# Patient Record
Sex: Male | Born: 1965 | Race: Black or African American | Hispanic: No | Marital: Married | Smoking: Current every day smoker
Health system: Southern US, Community
[De-identification: ages and names within clinical notes are randomized; demographics above are authoritative.]

## PROBLEM LIST (undated history)

## (undated) DIAGNOSIS — F419 Anxiety disorder, unspecified: Secondary | ICD-10-CM

## (undated) DIAGNOSIS — G629 Polyneuropathy, unspecified: Secondary | ICD-10-CM

## (undated) DIAGNOSIS — I10 Essential (primary) hypertension: Secondary | ICD-10-CM

## (undated) DIAGNOSIS — K219 Gastro-esophageal reflux disease without esophagitis: Secondary | ICD-10-CM

## (undated) DIAGNOSIS — E7800 Pure hypercholesterolemia, unspecified: Secondary | ICD-10-CM

## (undated) DIAGNOSIS — K861 Other chronic pancreatitis: Secondary | ICD-10-CM

## (undated) HISTORY — PX: COLON SURGERY: SHX602

## (undated) HISTORY — PX: CHOLECYSTECTOMY: SHX55

---
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
## 1999-07-02 ENCOUNTER — Encounter: Payer: Self-pay | Admitting: Family Medicine

## 1999-07-02 ENCOUNTER — Ambulatory Visit (HOSPITAL_COMMUNITY): Admission: RE | Admit: 1999-07-02 | Discharge: 1999-07-02 | Payer: Self-pay | Admitting: Family Medicine

## 2000-10-04 HISTORY — PX: LEG SURGERY: SHX1003

## 2001-02-12 ENCOUNTER — Emergency Department (HOSPITAL_COMMUNITY): Admission: EM | Admit: 2001-02-12 | Discharge: 2001-02-13 | Payer: Self-pay | Admitting: *Deleted

## 2001-02-16 ENCOUNTER — Encounter: Payer: Self-pay | Admitting: Emergency Medicine

## 2001-02-16 ENCOUNTER — Emergency Department (HOSPITAL_COMMUNITY): Admission: EM | Admit: 2001-02-16 | Discharge: 2001-02-16 | Payer: Self-pay | Admitting: Emergency Medicine

## 2001-02-21 ENCOUNTER — Encounter: Payer: Self-pay | Admitting: Family Medicine

## 2001-02-21 ENCOUNTER — Ambulatory Visit (HOSPITAL_COMMUNITY): Admission: RE | Admit: 2001-02-21 | Discharge: 2001-02-21 | Payer: Self-pay | Admitting: Internal Medicine

## 2001-03-02 ENCOUNTER — Ambulatory Visit (HOSPITAL_COMMUNITY): Admission: RE | Admit: 2001-03-02 | Discharge: 2001-03-02 | Payer: Self-pay | Admitting: Family Medicine

## 2001-03-04 ENCOUNTER — Emergency Department (HOSPITAL_COMMUNITY): Admission: EM | Admit: 2001-03-04 | Discharge: 2001-03-04 | Payer: Self-pay | Admitting: *Deleted

## 2001-03-04 ENCOUNTER — Encounter: Payer: Self-pay | Admitting: *Deleted

## 2001-03-05 ENCOUNTER — Emergency Department (HOSPITAL_COMMUNITY): Admission: EM | Admit: 2001-03-05 | Discharge: 2001-03-05 | Payer: Self-pay | Admitting: Emergency Medicine

## 2001-03-05 ENCOUNTER — Encounter: Payer: Self-pay | Admitting: Emergency Medicine

## 2001-04-11 ENCOUNTER — Ambulatory Visit (HOSPITAL_COMMUNITY): Admission: RE | Admit: 2001-04-11 | Discharge: 2001-04-11 | Payer: Self-pay | Admitting: Family Medicine

## 2001-06-01 ENCOUNTER — Ambulatory Visit (HOSPITAL_COMMUNITY): Admission: RE | Admit: 2001-06-01 | Discharge: 2001-06-01 | Payer: Self-pay | Admitting: *Deleted

## 2001-06-01 ENCOUNTER — Encounter: Payer: Self-pay | Admitting: *Deleted

## 2001-08-15 ENCOUNTER — Ambulatory Visit (HOSPITAL_COMMUNITY): Admission: RE | Admit: 2001-08-15 | Discharge: 2001-08-15 | Payer: Self-pay | Admitting: Family Medicine

## 2001-08-15 ENCOUNTER — Encounter: Payer: Self-pay | Admitting: Family Medicine

## 2001-08-18 ENCOUNTER — Encounter: Payer: Self-pay | Admitting: Family Medicine

## 2001-08-18 ENCOUNTER — Ambulatory Visit (HOSPITAL_COMMUNITY): Admission: RE | Admit: 2001-08-18 | Discharge: 2001-08-18 | Payer: Self-pay | Admitting: Family Medicine

## 2001-08-28 ENCOUNTER — Encounter: Payer: Self-pay | Admitting: General Surgery

## 2001-08-28 ENCOUNTER — Inpatient Hospital Stay (HOSPITAL_COMMUNITY): Admission: RE | Admit: 2001-08-28 | Discharge: 2001-08-30 | Payer: Self-pay | Admitting: General Surgery

## 2001-10-27 ENCOUNTER — Ambulatory Visit (HOSPITAL_COMMUNITY): Admission: RE | Admit: 2001-10-27 | Discharge: 2001-10-27 | Payer: Self-pay | Admitting: Internal Medicine

## 2001-10-27 ENCOUNTER — Encounter (INDEPENDENT_AMBULATORY_CARE_PROVIDER_SITE_OTHER): Payer: Self-pay | Admitting: Internal Medicine

## 2001-12-06 ENCOUNTER — Ambulatory Visit (HOSPITAL_COMMUNITY): Admission: RE | Admit: 2001-12-06 | Discharge: 2001-12-06 | Payer: Self-pay | Admitting: Internal Medicine

## 2001-12-06 ENCOUNTER — Encounter (INDEPENDENT_AMBULATORY_CARE_PROVIDER_SITE_OTHER): Payer: Self-pay | Admitting: Internal Medicine

## 2001-12-18 ENCOUNTER — Ambulatory Visit (HOSPITAL_COMMUNITY): Admission: RE | Admit: 2001-12-18 | Discharge: 2001-12-18 | Payer: Self-pay | Admitting: Internal Medicine

## 2001-12-18 ENCOUNTER — Encounter (INDEPENDENT_AMBULATORY_CARE_PROVIDER_SITE_OTHER): Payer: Self-pay | Admitting: Internal Medicine

## 2002-05-04 ENCOUNTER — Ambulatory Visit (HOSPITAL_COMMUNITY): Admission: RE | Admit: 2002-05-04 | Discharge: 2002-05-04 | Payer: Self-pay | Admitting: Internal Medicine

## 2002-05-04 ENCOUNTER — Encounter (INDEPENDENT_AMBULATORY_CARE_PROVIDER_SITE_OTHER): Payer: Self-pay | Admitting: Internal Medicine

## 2002-10-26 ENCOUNTER — Encounter (INDEPENDENT_AMBULATORY_CARE_PROVIDER_SITE_OTHER): Payer: Self-pay | Admitting: Internal Medicine

## 2002-10-26 ENCOUNTER — Ambulatory Visit (HOSPITAL_COMMUNITY): Admission: RE | Admit: 2002-10-26 | Discharge: 2002-10-26 | Payer: Self-pay | Admitting: Internal Medicine

## 2002-11-13 ENCOUNTER — Ambulatory Visit (HOSPITAL_COMMUNITY): Admission: RE | Admit: 2002-11-13 | Discharge: 2002-11-13 | Payer: Self-pay | Admitting: General Surgery

## 2003-04-30 ENCOUNTER — Ambulatory Visit (HOSPITAL_COMMUNITY): Admission: RE | Admit: 2003-04-30 | Discharge: 2003-04-30 | Payer: Self-pay | Admitting: Family Medicine

## 2003-04-30 ENCOUNTER — Encounter: Payer: Self-pay | Admitting: Family Medicine

## 2004-08-10 ENCOUNTER — Ambulatory Visit: Payer: Self-pay | Admitting: Urgent Care

## 2004-08-11 ENCOUNTER — Ambulatory Visit: Payer: Self-pay | Admitting: Cardiology

## 2004-08-12 ENCOUNTER — Ambulatory Visit: Payer: Self-pay | Admitting: *Deleted

## 2004-08-12 ENCOUNTER — Ambulatory Visit (HOSPITAL_COMMUNITY): Admission: RE | Admit: 2004-08-12 | Discharge: 2004-08-12 | Payer: Self-pay | Admitting: Family Medicine

## 2004-08-13 ENCOUNTER — Ambulatory Visit: Payer: Self-pay | Admitting: Cardiology

## 2004-08-19 ENCOUNTER — Ambulatory Visit (HOSPITAL_COMMUNITY): Admission: RE | Admit: 2004-08-19 | Discharge: 2004-08-19 | Payer: Self-pay | Admitting: Internal Medicine

## 2004-09-09 ENCOUNTER — Ambulatory Visit: Payer: Self-pay | Admitting: Family Medicine

## 2004-10-01 ENCOUNTER — Ambulatory Visit: Payer: Self-pay | Admitting: Family Medicine

## 2004-10-07 ENCOUNTER — Ambulatory Visit (HOSPITAL_COMMUNITY): Admission: RE | Admit: 2004-10-07 | Discharge: 2004-10-07 | Payer: Self-pay | Admitting: Family Medicine

## 2004-11-02 ENCOUNTER — Ambulatory Visit: Payer: Self-pay | Admitting: Family Medicine

## 2004-11-04 ENCOUNTER — Ambulatory Visit: Payer: Self-pay | Admitting: Family Medicine

## 2004-11-18 ENCOUNTER — Ambulatory Visit: Payer: Self-pay | Admitting: Internal Medicine

## 2004-12-01 ENCOUNTER — Ambulatory Visit: Payer: Self-pay | Admitting: Family Medicine

## 2004-12-07 ENCOUNTER — Ambulatory Visit: Payer: Self-pay | Admitting: Cardiology

## 2004-12-29 ENCOUNTER — Ambulatory Visit: Payer: Self-pay | Admitting: Family Medicine

## 2005-01-19 ENCOUNTER — Ambulatory Visit: Payer: Self-pay | Admitting: Internal Medicine

## 2005-01-20 ENCOUNTER — Ambulatory Visit (HOSPITAL_COMMUNITY): Admission: RE | Admit: 2005-01-20 | Discharge: 2005-01-20 | Payer: Self-pay | Admitting: Internal Medicine

## 2005-02-18 ENCOUNTER — Ambulatory Visit: Payer: Self-pay | Admitting: Family Medicine

## 2005-02-25 ENCOUNTER — Ambulatory Visit: Payer: Self-pay | Admitting: Internal Medicine

## 2005-03-04 ENCOUNTER — Ambulatory Visit: Payer: Self-pay | Admitting: Family Medicine

## 2005-04-05 ENCOUNTER — Ambulatory Visit: Payer: Self-pay | Admitting: Family Medicine

## 2005-04-27 ENCOUNTER — Ambulatory Visit: Payer: Self-pay | Admitting: Internal Medicine

## 2005-05-31 ENCOUNTER — Ambulatory Visit: Payer: Self-pay | Admitting: Family Medicine

## 2005-07-30 ENCOUNTER — Ambulatory Visit: Payer: Self-pay | Admitting: *Deleted

## 2005-08-05 ENCOUNTER — Ambulatory Visit: Payer: Self-pay | Admitting: Family Medicine

## 2005-08-19 ENCOUNTER — Ambulatory Visit: Payer: Self-pay | Admitting: Family Medicine

## 2005-08-31 ENCOUNTER — Ambulatory Visit: Payer: Self-pay | Admitting: Internal Medicine

## 2005-10-25 ENCOUNTER — Ambulatory Visit: Payer: Self-pay | Admitting: Family Medicine

## 2005-11-24 ENCOUNTER — Ambulatory Visit: Payer: Self-pay | Admitting: Family Medicine

## 2005-12-09 ENCOUNTER — Ambulatory Visit: Payer: Self-pay | Admitting: Internal Medicine

## 2005-12-10 ENCOUNTER — Ambulatory Visit (HOSPITAL_COMMUNITY): Admission: RE | Admit: 2005-12-10 | Discharge: 2005-12-10 | Payer: Self-pay | Admitting: Internal Medicine

## 2006-01-31 ENCOUNTER — Ambulatory Visit: Payer: Self-pay | Admitting: Family Medicine

## 2006-03-03 ENCOUNTER — Ambulatory Visit: Payer: Self-pay | Admitting: Family Medicine

## 2006-04-14 ENCOUNTER — Ambulatory Visit: Payer: Self-pay | Admitting: Family Medicine

## 2006-04-28 ENCOUNTER — Ambulatory Visit: Payer: Self-pay | Admitting: Family Medicine

## 2006-05-09 ENCOUNTER — Ambulatory Visit: Payer: Self-pay | Admitting: Internal Medicine

## 2006-05-11 ENCOUNTER — Ambulatory Visit: Payer: Self-pay | Admitting: Family Medicine

## 2006-06-27 ENCOUNTER — Ambulatory Visit (HOSPITAL_COMMUNITY): Admission: RE | Admit: 2006-06-27 | Discharge: 2006-06-27 | Payer: Self-pay | Admitting: Nephrology

## 2006-07-07 ENCOUNTER — Ambulatory Visit: Payer: Self-pay | Admitting: Family Medicine

## 2006-07-20 ENCOUNTER — Ambulatory Visit: Payer: Self-pay | Admitting: Internal Medicine

## 2006-08-02 ENCOUNTER — Ambulatory Visit: Payer: Self-pay | Admitting: Family Medicine

## 2006-08-11 ENCOUNTER — Ambulatory Visit: Payer: Self-pay | Admitting: Family Medicine

## 2006-09-16 ENCOUNTER — Ambulatory Visit: Payer: Self-pay | Admitting: Family Medicine

## 2006-10-26 ENCOUNTER — Ambulatory Visit: Payer: Self-pay | Admitting: Family Medicine

## 2006-11-28 ENCOUNTER — Ambulatory Visit: Payer: Self-pay | Admitting: Family Medicine

## 2006-11-28 LAB — CONVERTED CEMR LAB
ALT: 16 units/L (ref 0–53)
Albumin: 3.7 g/dL (ref 3.5–5.2)
Bilirubin, Direct: 0.1 mg/dL (ref 0.0–0.3)
Total Protein: 7.5 g/dL (ref 6.0–8.3)

## 2007-01-05 ENCOUNTER — Encounter: Payer: Self-pay | Admitting: Family Medicine

## 2007-01-05 ENCOUNTER — Ambulatory Visit: Payer: Self-pay | Admitting: Internal Medicine

## 2007-01-05 LAB — CONVERTED CEMR LAB
ALT: 26 units/L (ref 0–53)
AST: 29 units/L (ref 0–37)
Alkaline Phosphatase: 248 units/L — ABNORMAL HIGH (ref 39–117)
Bilirubin, Direct: <0.1 mg/dL (ref 0.0–0.3)
Cholesterol: 343 mg/dL — ABNORMAL HIGH (ref 0–200)
HDL: 31 mg/dL — ABNORMAL LOW (ref >39)
Triglycerides: 1477 mg/dL — ABNORMAL HIGH (ref <150)

## 2007-01-24 ENCOUNTER — Ambulatory Visit: Payer: Self-pay | Admitting: Family Medicine

## 2007-02-08 ENCOUNTER — Ambulatory Visit (HOSPITAL_COMMUNITY): Admission: RE | Admit: 2007-02-08 | Discharge: 2007-02-08 | Payer: Self-pay | Admitting: Family Medicine

## 2007-03-09 ENCOUNTER — Ambulatory Visit: Payer: Self-pay | Admitting: Family Medicine

## 2007-03-15 ENCOUNTER — Encounter: Payer: Self-pay | Admitting: Family Medicine

## 2007-03-15 LAB — CONVERTED CEMR LAB
ALT: 29 units/L (ref 0–53)
Albumin: 3.6 g/dL (ref 3.5–5.2)
Bilirubin, Direct: 0.1 mg/dL (ref 0.0–0.3)
Total Bilirubin: 0.4 mg/dL (ref 0.3–1.2)
Total Protein: 6.8 g/dL (ref 6.0–8.3)

## 2007-05-09 ENCOUNTER — Ambulatory Visit: Payer: Self-pay | Admitting: Family Medicine

## 2007-06-02 ENCOUNTER — Encounter: Payer: Self-pay | Admitting: Family Medicine

## 2007-06-02 LAB — CONVERTED CEMR LAB
ALT: 16 units/L (ref 0–53)
Albumin: 3.8 g/dL (ref 3.5–5.2)
Glucose, Bld: 365 mg/dL — ABNORMAL HIGH (ref 70–99)
Indirect Bilirubin: 0.4 mg/dL (ref 0.0–0.9)
Microalb, Ur: 15.9 mg/dL — ABNORMAL HIGH (ref 0.00–1.89)
PSA: 0.34 ng/mL (ref 0.10–4.00)
Sodium: 133 meq/L — ABNORMAL LOW (ref 135–145)

## 2007-07-06 ENCOUNTER — Ambulatory Visit: Payer: Self-pay | Admitting: Family Medicine

## 2007-07-06 LAB — CONVERTED CEMR LAB
ALT: 19 units/L (ref 0–53)
Albumin: 3.9 g/dL (ref 3.5–5.2)
Bilirubin, Direct: 0.1 mg/dL (ref 0.0–0.3)

## 2007-08-15 ENCOUNTER — Ambulatory Visit: Payer: Self-pay | Admitting: Family Medicine

## 2007-08-15 LAB — CONVERTED CEMR LAB
ALT: 24 units/L (ref 0–53)
Amylase: 104 units/L (ref 0–105)
CO2: 26 meq/L (ref 19–32)
Calcium: 9.1 mg/dL (ref 8.4–10.5)
Chloride: 101 meq/L (ref 96–112)
Creatinine, Ser: 0.94 mg/dL (ref 0.40–1.50)
Lipase: 49 units/L (ref 0–75)
Sodium: 138 meq/L (ref 135–145)
Total Bilirubin: 0.6 mg/dL (ref 0.3–1.2)
Total Protein: 7.3 g/dL (ref 6.0–8.3)

## 2007-08-17 ENCOUNTER — Ambulatory Visit (HOSPITAL_COMMUNITY): Admission: RE | Admit: 2007-08-17 | Discharge: 2007-08-17 | Payer: Self-pay | Admitting: Family Medicine

## 2007-09-26 ENCOUNTER — Ambulatory Visit: Payer: Self-pay | Admitting: Family Medicine

## 2007-10-05 ENCOUNTER — Encounter: Payer: Self-pay | Admitting: Family Medicine

## 2007-10-19 ENCOUNTER — Encounter: Payer: Self-pay | Admitting: Family Medicine

## 2007-10-19 LAB — CONVERTED CEMR LAB
Albumin: 4.1 g/dL (ref 3.5–5.2)
Bilirubin, Direct: 0.1 mg/dL (ref 0.0–0.3)
HDL: 31 mg/dL — ABNORMAL LOW (ref >39)
Total Bilirubin: 0.6 mg/dL (ref 0.3–1.2)
Total CHOL/HDL Ratio: 13.1
Triglycerides: 2084 mg/dL — ABNORMAL HIGH (ref <150)

## 2007-11-28 ENCOUNTER — Ambulatory Visit: Payer: Self-pay | Admitting: Family Medicine

## 2007-11-29 ENCOUNTER — Ambulatory Visit (HOSPITAL_COMMUNITY): Admission: RE | Admit: 2007-11-29 | Discharge: 2007-11-29 | Payer: Self-pay | Admitting: Family Medicine

## 2007-11-30 ENCOUNTER — Telehealth: Payer: Self-pay | Admitting: Family Medicine

## 2008-01-19 ENCOUNTER — Ambulatory Visit: Payer: Self-pay | Admitting: Family Medicine

## 2008-01-26 DIAGNOSIS — E1065 Type 1 diabetes mellitus with hyperglycemia: Secondary | ICD-10-CM

## 2008-01-26 DIAGNOSIS — E785 Hyperlipidemia, unspecified: Secondary | ICD-10-CM | POA: Insufficient documentation

## 2008-01-26 DIAGNOSIS — F17200 Nicotine dependence, unspecified, uncomplicated: Secondary | ICD-10-CM

## 2008-01-26 DIAGNOSIS — K219 Gastro-esophageal reflux disease without esophagitis: Secondary | ICD-10-CM

## 2008-01-26 DIAGNOSIS — I10 Essential (primary) hypertension: Secondary | ICD-10-CM

## 2008-01-26 DIAGNOSIS — M549 Dorsalgia, unspecified: Secondary | ICD-10-CM | POA: Insufficient documentation

## 2008-01-26 DIAGNOSIS — IMO0002 Reserved for concepts with insufficient information to code with codable children: Secondary | ICD-10-CM | POA: Insufficient documentation

## 2008-01-26 DIAGNOSIS — F341 Dysthymic disorder: Secondary | ICD-10-CM

## 2008-01-26 DIAGNOSIS — R109 Unspecified abdominal pain: Secondary | ICD-10-CM | POA: Insufficient documentation

## 2008-02-02 ENCOUNTER — Ambulatory Visit: Payer: Self-pay | Admitting: Family Medicine

## 2008-11-23 ENCOUNTER — Ambulatory Visit: Payer: Self-pay | Admitting: Gastroenterology

## 2008-11-23 ENCOUNTER — Inpatient Hospital Stay (HOSPITAL_COMMUNITY): Admission: EM | Admit: 2008-11-23 | Discharge: 2008-11-27 | Payer: Self-pay | Admitting: Emergency Medicine

## 2008-11-25 ENCOUNTER — Ambulatory Visit: Payer: Self-pay | Admitting: Gastroenterology

## 2008-11-27 ENCOUNTER — Ambulatory Visit: Payer: Self-pay | Admitting: Internal Medicine

## 2008-12-24 ENCOUNTER — Encounter: Payer: Self-pay | Admitting: Gastroenterology

## 2009-12-12 ENCOUNTER — Emergency Department (HOSPITAL_COMMUNITY): Admission: EM | Admit: 2009-12-12 | Discharge: 2009-12-12 | Payer: Self-pay | Admitting: Emergency Medicine

## 2010-05-12 ENCOUNTER — Inpatient Hospital Stay (HOSPITAL_COMMUNITY): Admission: EM | Admit: 2010-05-12 | Discharge: 2010-05-14 | Payer: Self-pay | Admitting: Emergency Medicine

## 2010-09-08 ENCOUNTER — Emergency Department (HOSPITAL_COMMUNITY)
Admission: EM | Admit: 2010-09-08 | Discharge: 2010-09-08 | Payer: Self-pay | Source: Home / Self Care | Admitting: Emergency Medicine

## 2010-09-10 ENCOUNTER — Observation Stay (HOSPITAL_COMMUNITY): Admission: EM | Admit: 2010-09-10 | Discharge: 2009-12-30 | Payer: Self-pay | Admitting: Emergency Medicine

## 2010-10-25 ENCOUNTER — Encounter: Payer: Self-pay | Admitting: Nephrology

## 2010-10-25 ENCOUNTER — Encounter: Payer: Self-pay | Admitting: Family Medicine

## 2010-11-01 ENCOUNTER — Emergency Department (HOSPITAL_COMMUNITY)
Admission: EM | Admit: 2010-11-01 | Discharge: 2010-11-02 | Payer: Self-pay | Source: Home / Self Care | Admitting: Emergency Medicine

## 2010-11-01 LAB — CBC
HCT: 35 % — ABNORMAL LOW (ref 39.0–52.0)
MCH: 28 pg (ref 26.0–34.0)
MCHC: 34.3 g/dL (ref 30.0–36.0)
Platelets: 242 10*3/uL (ref 150–400)
RBC: 4.29 MIL/uL (ref 4.22–5.81)
WBC: 7 10*3/uL (ref 4.0–10.5)

## 2010-11-01 LAB — COMPREHENSIVE METABOLIC PANEL
ALT: 9 U/L (ref 0–53)
Alkaline Phosphatase: 126 U/L — ABNORMAL HIGH (ref 39–117)
Creatinine, Ser: 1.39 mg/dL (ref 0.4–1.5)
GFR calc non Af Amer: 55 mL/min — ABNORMAL LOW (ref >60)
Sodium: 135 mEq/L (ref 135–145)
Total Bilirubin: 0.2 mg/dL — ABNORMAL LOW (ref 0.3–1.2)
Total Protein: 6.2 g/dL (ref 6.0–8.3)

## 2010-11-01 LAB — GLUCOSE, CAPILLARY: Glucose-Capillary: 162 mg/dL — ABNORMAL HIGH (ref 70–99)

## 2010-11-01 LAB — DIFFERENTIAL
Basophils Relative: 1 % (ref 0–1)
Lymphs Abs: 2.2 10*3/uL (ref 0.7–4.0)
Monocytes Relative: 7 % (ref 3–12)
Neutrophils Relative %: 51 % (ref 43–77)

## 2010-11-01 LAB — LIPASE, BLOOD: Lipase: 20 U/L (ref 11–59)

## 2010-11-02 ENCOUNTER — Emergency Department (HOSPITAL_COMMUNITY)
Admission: EM | Admit: 2010-11-02 | Discharge: 2010-11-03 | Payer: Self-pay | Source: Home / Self Care | Admitting: Emergency Medicine

## 2010-11-02 LAB — URINALYSIS, ROUTINE W REFLEX MICROSCOPIC
Bilirubin Urine: NEGATIVE
Ketones, ur: NEGATIVE mg/dL
Nitrite: NEGATIVE
Protein, ur: NEGATIVE mg/dL
Specific Gravity, Urine: >1.03 — ABNORMAL HIGH (ref 1.005–1.030)
Urobilinogen, UA: 0.2 mg/dL (ref 0.0–1.0)
pH: 6 (ref 5.0–8.0)

## 2010-11-03 LAB — BASIC METABOLIC PANEL
BUN: 10 mg/dL (ref 6–23)
CO2: 20 mEq/L (ref 19–32)
Chloride: 113 mEq/L — ABNORMAL HIGH (ref 96–112)
Potassium: 5.1 mEq/L (ref 3.5–5.1)

## 2010-11-03 LAB — GLUCOSE, CAPILLARY: Glucose-Capillary: 175 mg/dL — ABNORMAL HIGH (ref 70–99)

## 2010-11-03 NOTE — Letter (Signed)
Summary: office notes chart 1  office notes chart 1   Imported By: Lind Guest 06/26/2010 13:13:34  _____________________________________________________________________  External Attachment:    Type:   Image     Comment:   External Document

## 2010-11-03 NOTE — Letter (Signed)
Summary: office notes chart 2  office notes chart 2   Imported By: Lind Guest 06/30/2010 09:05:18  _____________________________________________________________________  External Attachment:    Type:   Image     Comment:   External Document

## 2010-11-03 NOTE — Letter (Signed)
Summary: labs  labs   Imported By: Lind Guest 06/30/2010 08:58:18  _____________________________________________________________________  External Attachment:    Type:   Image     Comment:   External Document

## 2010-11-03 NOTE — Letter (Signed)
Summary: PHONE NOTES CHART 2  PHONE NOTES CHART 2   Imported By: Lind Guest 06/30/2010 08:49:04  _____________________________________________________________________  External Attachment:    Type:   Image     Comment:   External Document

## 2010-11-03 NOTE — Letter (Signed)
Summary: misc chart 1  misc chart 1   Imported By: Lind Guest 06/26/2010 13:12:19  _____________________________________________________________________  External Attachment:    Type:   Image     Comment:   External Document

## 2010-11-03 NOTE — Letter (Signed)
Summary: consults chart 1  consults chart 1   Imported By: Lind Guest 06/26/2010 13:13:00  _____________________________________________________________________  External Attachment:    Type:   Image     Comment:   External Document

## 2010-11-03 NOTE — Letter (Signed)
Summary: demo chart 2  demo chart 2   Imported By: Lind Guest 06/30/2010 09:26:13  _____________________________________________________________________  External Attachment:    Type:   Image     Comment:   External Document

## 2010-11-03 NOTE — Letter (Signed)
Summary: labs chart 1  labs chart 1   Imported By: Lind Guest 06/26/2010 13:11:36  _____________________________________________________________________  External Attachment:    Type:   Image     Comment:   External Document

## 2010-11-03 NOTE — Letter (Signed)
Summary: x rays chart 2  x rays chart 2   Imported By: Lind Guest 06/30/2010 08:59:29  _____________________________________________________________________  External Attachment:    Type:   Image     Comment:   External Document

## 2010-11-03 NOTE — Letter (Signed)
Summary: MISC CHART 2  MISC CHART 2   Imported By: Lind Guest 06/30/2010 08:52:45  _____________________________________________________________________  External Attachment:    Type:   Image     Comment:   External Document

## 2010-11-03 NOTE — Letter (Signed)
Summary: CONSULTS CHART 2  CONSULTS CHART 2   Imported By: Lind Guest 06/30/2010 08:48:30  _____________________________________________________________________  External Attachment:    Type:   Image     Comment:   External Document

## 2010-11-03 NOTE — Letter (Signed)
Summary: phone notes chart 1  phone notes chart 1   Imported By: Lind Guest 06/26/2010 13:14:25  _____________________________________________________________________  External Attachment:    Type:   Image     Comment:   External Document

## 2010-11-03 NOTE — Letter (Signed)
Summary: history and physical chart 2  history and physical chart 2   Imported By: Lind Guest 06/30/2010 09:25:15  _____________________________________________________________________  External Attachment:    Type:   Image     Comment:   External Document

## 2010-11-03 NOTE — Letter (Signed)
Summary: demo chart 1  demo chart 1   Imported By: Lind Guest 06/26/2010 13:10:10  _____________________________________________________________________  External Attachment:    Type:   Image     Comment:   External Document

## 2010-12-14 LAB — DIFFERENTIAL
Lymphocytes Relative: 39 % (ref 12–46)
Lymphs Abs: 3.9 10*3/uL (ref 0.7–4.0)
Monocytes Relative: 6 % (ref 3–12)
Neutro Abs: 4.7 10*3/uL (ref 1.7–7.7)
Neutrophils Relative %: 47 % (ref 43–77)

## 2010-12-14 LAB — BASIC METABOLIC PANEL
CO2: 17 mEq/L — ABNORMAL LOW (ref 19–32)
Calcium: 9 mg/dL (ref 8.4–10.5)
GFR calc Af Amer: 41 mL/min — ABNORMAL LOW (ref >60)
GFR calc non Af Amer: 34 mL/min — ABNORMAL LOW (ref >60)
Potassium: 4.3 mEq/L (ref 3.5–5.1)
Sodium: 136 mEq/L (ref 135–145)

## 2010-12-14 LAB — URINALYSIS, ROUTINE W REFLEX MICROSCOPIC
Bilirubin Urine: NEGATIVE
Glucose, UA: 250 mg/dL — AB
Nitrite: NEGATIVE
Specific Gravity, Urine: >1.03 — ABNORMAL HIGH (ref 1.005–1.030)
pH: 5 (ref 5.0–8.0)

## 2010-12-14 LAB — HEPATIC FUNCTION PANEL
ALT: 11 U/L (ref 0–53)
AST: 17 U/L (ref 0–37)
Albumin: 3 g/dL — ABNORMAL LOW (ref 3.5–5.2)
Alkaline Phosphatase: 193 U/L — ABNORMAL HIGH (ref 39–117)
Total Protein: 6.7 g/dL (ref 6.0–8.3)

## 2010-12-14 LAB — CBC
Hemoglobin: 13.4 g/dL (ref 13.0–17.0)
RBC: 4.8 MIL/uL (ref 4.22–5.81)

## 2010-12-18 LAB — GLUCOSE, CAPILLARY
Glucose-Capillary: 113 mg/dL — ABNORMAL HIGH (ref 70–99)
Glucose-Capillary: 127 mg/dL — ABNORMAL HIGH (ref 70–99)
Glucose-Capillary: 175 mg/dL — ABNORMAL HIGH (ref 70–99)
Glucose-Capillary: 287 mg/dL — ABNORMAL HIGH (ref 70–99)
Glucose-Capillary: 42 mg/dL — CL (ref 70–99)
Glucose-Capillary: 460 mg/dL — ABNORMAL HIGH (ref 70–99)
Glucose-Capillary: 53 mg/dL — ABNORMAL LOW (ref 70–99)

## 2010-12-18 LAB — BASIC METABOLIC PANEL
CO2: 19 mEq/L (ref 19–32)
Chloride: 111 mEq/L (ref 96–112)
Creatinine, Ser: 1.38 mg/dL (ref 0.4–1.5)
GFR calc Af Amer: >60 mL/min (ref >60)

## 2010-12-18 LAB — URINE MICROSCOPIC-ADD ON

## 2010-12-18 LAB — COMPREHENSIVE METABOLIC PANEL
Alkaline Phosphatase: 186 U/L — ABNORMAL HIGH (ref 39–117)
BUN: 24 mg/dL — ABNORMAL HIGH (ref 6–23)
Glucose, Bld: 457 mg/dL — ABNORMAL HIGH (ref 70–99)
Potassium: 4.1 mEq/L (ref 3.5–5.1)
Total Protein: 7.4 g/dL (ref 6.0–8.3)

## 2010-12-18 LAB — CBC
HCT: 43.5 % (ref 39.0–52.0)
MCH: 28.1 pg (ref 26.0–34.0)
MCH: 28.2 pg (ref 26.0–34.0)
MCHC: 33.3 g/dL (ref 30.0–36.0)
MCV: 84 fL (ref 78.0–100.0)
MCV: 84.3 fL (ref 78.0–100.0)
Platelets: 191 10*3/uL (ref 150–400)
RBC: 4.17 MIL/uL — ABNORMAL LOW (ref 4.22–5.81)
RDW: 14.6 % (ref 11.5–15.5)
WBC: 10.7 10*3/uL — ABNORMAL HIGH (ref 4.0–10.5)

## 2010-12-18 LAB — URINE CULTURE

## 2010-12-18 LAB — CHLORIDE, URINE, RANDOM: Chloride Urine: 37 mEq/L

## 2010-12-18 LAB — BLOOD GAS, ARTERIAL
Bicarbonate: 16.5 mEq/L — ABNORMAL LOW (ref 20.0–24.0)
FIO2: 0.21 %
O2 Saturation: 97.4 %
Patient temperature: 37

## 2010-12-18 LAB — RAPID URINE DRUG SCREEN, HOSP PERFORMED
Benzodiazepines: NOT DETECTED
Cocaine: NOT DETECTED
Tetrahydrocannabinol: NOT DETECTED

## 2010-12-18 LAB — DIFFERENTIAL
Basophils Absolute: 0.1 10*3/uL (ref 0.0–0.1)
Basophils Relative: 1 % (ref 0–1)
Eosinophils Absolute: 0.3 10*3/uL (ref 0.0–0.7)
Eosinophils Relative: 4 % (ref 0–5)
Lymphs Abs: 2.6 10*3/uL (ref 0.7–4.0)
Monocytes Relative: 2 % — ABNORMAL LOW (ref 3–12)
Monocytes Relative: 6 % (ref 3–12)
Neutro Abs: 7.9 10*3/uL — ABNORMAL HIGH (ref 1.7–7.7)
Neutrophils Relative %: 73 % (ref 43–77)

## 2010-12-18 LAB — CULTURE, BLOOD (ROUTINE X 2)
Report Status: 8142011
Report Status: 8142011

## 2010-12-18 LAB — HEMOGLOBIN A1C
Hgb A1c MFr Bld: 11.8 % — ABNORMAL HIGH (ref <5.7)
Hgb A1c MFr Bld: 12.6 % — ABNORMAL HIGH (ref <5.7)
Mean Plasma Glucose: 315 mg/dL — ABNORMAL HIGH (ref <117)

## 2010-12-18 LAB — LIPID PANEL
Cholesterol: 183 mg/dL (ref 0–200)
LDL Cholesterol: 82 mg/dL (ref 0–99)
Triglycerides: 325 mg/dL — ABNORMAL HIGH (ref <150)

## 2010-12-18 LAB — URINALYSIS, ROUTINE W REFLEX MICROSCOPIC
Glucose, UA: 500 mg/dL — AB
pH: 5.5 (ref 5.0–8.0)

## 2010-12-18 LAB — TSH: TSH: 0.526 u[IU]/mL (ref 0.350–4.500)

## 2010-12-18 LAB — PROTIME-INR
INR: 1.18 (ref 0.00–1.49)
Prothrombin Time: 15.2 seconds (ref 11.6–15.2)

## 2010-12-25 LAB — MRSA PCR SCREENING: MRSA by PCR: NEGATIVE

## 2010-12-25 LAB — COMPREHENSIVE METABOLIC PANEL
ALT: 14 U/L (ref 0–53)
AST: 17 U/L (ref 0–37)
Albumin: 3.3 g/dL — ABNORMAL LOW (ref 3.5–5.2)
Alkaline Phosphatase: 180 U/L — ABNORMAL HIGH (ref 39–117)
BUN: 24 mg/dL — ABNORMAL HIGH (ref 6–23)
CO2: 23 mEq/L (ref 19–32)
Calcium: 9.1 mg/dL (ref 8.4–10.5)
Chloride: 90 mEq/L — ABNORMAL LOW (ref 96–112)
Creatinine, Ser: 1.48 mg/dL (ref 0.4–1.5)
GFR calc Af Amer: >60 mL/min (ref >60)
GFR calc non Af Amer: 51 mL/min — ABNORMAL LOW (ref >60)
Glucose, Bld: 840 mg/dL (ref 70–99)
Potassium: 4.9 mEq/L (ref 3.5–5.1)
Sodium: 122 mEq/L — ABNORMAL LOW (ref 135–145)
Total Bilirubin: 0.8 mg/dL (ref 0.3–1.2)
Total Protein: 7.3 g/dL (ref 6.0–8.3)

## 2010-12-25 LAB — BASIC METABOLIC PANEL
BUN: 21 mg/dL (ref 6–23)
BUN: 22 mg/dL (ref 6–23)
Chloride: 100 mEq/L (ref 96–112)
Chloride: 103 mEq/L (ref 96–112)
Creatinine, Ser: 1.13 mg/dL (ref 0.4–1.5)
Glucose, Bld: 595 mg/dL (ref 70–99)
Potassium: 4 mEq/L (ref 3.5–5.1)

## 2010-12-25 LAB — DIFFERENTIAL
Basophils Absolute: 0 10*3/uL (ref 0.0–0.1)
Basophils Absolute: 0.1 10*3/uL (ref 0.0–0.1)
Basophils Relative: 1 % (ref 0–1)
Basophils Relative: 1 % (ref 0–1)
Eosinophils Absolute: 0.3 10*3/uL (ref 0.0–0.7)
Eosinophils Absolute: 0.6 10*3/uL (ref 0.0–0.7)
Eosinophils Relative: 4 % (ref 0–5)
Eosinophils Relative: 6 % — ABNORMAL HIGH (ref 0–5)
Lymphocytes Relative: 25 % (ref 12–46)
Lymphocytes Relative: 29 % (ref 12–46)
Lymphs Abs: 2.3 10*3/uL (ref 0.7–4.0)
Lymphs Abs: 2.4 10*3/uL (ref 0.7–4.0)
Monocytes Absolute: 0.5 10*3/uL (ref 0.1–1.0)
Monocytes Absolute: 0.7 10*3/uL (ref 0.1–1.0)
Monocytes Relative: 6 % (ref 3–12)
Monocytes Relative: 7 % (ref 3–12)
Neutro Abs: 4.8 10*3/uL (ref 1.7–7.7)
Neutro Abs: 6 10*3/uL (ref 1.7–7.7)
Neutrophils Relative %: 60 % (ref 43–77)
Neutrophils Relative %: 61 % (ref 43–77)

## 2010-12-25 LAB — URINALYSIS, ROUTINE W REFLEX MICROSCOPIC
Bilirubin Urine: NEGATIVE
Glucose, UA: >1000 mg/dL — AB
Ketones, ur: NEGATIVE mg/dL
Leukocytes, UA: NEGATIVE
Nitrite: NEGATIVE
Protein, ur: NEGATIVE mg/dL
Specific Gravity, Urine: <1.005 — ABNORMAL LOW (ref 1.005–1.030)
Urobilinogen, UA: 0.2 mg/dL (ref 0.0–1.0)
pH: 5.5 (ref 5.0–8.0)

## 2010-12-25 LAB — GLUCOSE, CAPILLARY
Glucose-Capillary: 118 mg/dL — ABNORMAL HIGH (ref 70–99)
Glucose-Capillary: 219 mg/dL — ABNORMAL HIGH (ref 70–99)
Glucose-Capillary: 231 mg/dL — ABNORMAL HIGH (ref 70–99)
Glucose-Capillary: 61 mg/dL — ABNORMAL LOW (ref 70–99)

## 2010-12-25 LAB — BLOOD GAS, ARTERIAL
Acid-base deficit: 2.8 mmol/L — ABNORMAL HIGH (ref 0.0–2.0)
Bicarbonate: 22.7 mEq/L (ref 20.0–24.0)
FIO2: 21 %
O2 Saturation: 63.2 %
Patient temperature: 37
pCO2 arterial: 48.1 mmHg — ABNORMAL HIGH (ref 35.0–45.0)
pH, Arterial: 7.296 — ABNORMAL LOW (ref 7.350–7.450)
pO2, Arterial: 32.7 mmHg — CL (ref 80.0–100.0)

## 2010-12-25 LAB — CBC
Hemoglobin: 15.4 g/dL (ref 13.0–17.0)
MCHC: 33.2 g/dL (ref 30.0–36.0)
MCHC: 34.8 g/dL (ref 30.0–36.0)
MCV: 81.8 fL (ref 78.0–100.0)
MCV: 86.1 fL (ref 78.0–100.0)
Platelets: 167 10*3/uL (ref 150–400)
RDW: 13.9 % (ref 11.5–15.5)
RDW: 14.2 % (ref 11.5–15.5)
WBC: 9.9 10*3/uL (ref 4.0–10.5)

## 2010-12-25 LAB — URINE MICROSCOPIC-ADD ON

## 2010-12-25 LAB — LIPASE, BLOOD: Lipase: 20 U/L (ref 11–59)

## 2010-12-25 LAB — KETONES, QUALITATIVE: Acetone, Bld: NEGATIVE

## 2010-12-28 ENCOUNTER — Ambulatory Visit (INDEPENDENT_AMBULATORY_CARE_PROVIDER_SITE_OTHER): Payer: Medicare Other | Admitting: Internal Medicine

## 2010-12-28 ENCOUNTER — Other Ambulatory Visit (INDEPENDENT_AMBULATORY_CARE_PROVIDER_SITE_OTHER): Payer: Self-pay | Admitting: Internal Medicine

## 2010-12-28 DIAGNOSIS — K7460 Unspecified cirrhosis of liver: Secondary | ICD-10-CM

## 2010-12-28 DIAGNOSIS — K861 Other chronic pancreatitis: Secondary | ICD-10-CM

## 2010-12-28 DIAGNOSIS — R1013 Epigastric pain: Secondary | ICD-10-CM

## 2010-12-28 DIAGNOSIS — K7689 Other specified diseases of liver: Secondary | ICD-10-CM

## 2010-12-31 ENCOUNTER — Ambulatory Visit: Payer: Self-pay | Admitting: Pain Medicine

## 2011-01-01 ENCOUNTER — Ambulatory Visit (HOSPITAL_COMMUNITY)
Admission: RE | Admit: 2011-01-01 | Discharge: 2011-01-01 | Disposition: A | Discharge status: Home / Self Care | Payer: Medicare Other | Source: Ambulatory Visit | Attending: Internal Medicine | Admitting: Internal Medicine

## 2011-01-01 DIAGNOSIS — R933 Abnormal findings on diagnostic imaging of other parts of digestive tract: Secondary | ICD-10-CM | POA: Insufficient documentation

## 2011-01-01 DIAGNOSIS — K7460 Unspecified cirrhosis of liver: Secondary | ICD-10-CM

## 2011-01-01 DIAGNOSIS — K861 Other chronic pancreatitis: Secondary | ICD-10-CM | POA: Insufficient documentation

## 2011-01-01 DIAGNOSIS — R935 Abnormal findings on diagnostic imaging of other abdominal regions, including retroperitoneum: Secondary | ICD-10-CM | POA: Insufficient documentation

## 2011-01-01 DIAGNOSIS — R1013 Epigastric pain: Secondary | ICD-10-CM

## 2011-01-10 NOTE — Consult Note (Signed)
[name] [name], [name] [name]              ACCOUNT NO.:  [id number]  MEDICAL RECORD NO.:  [id number]           PATIENT TYPE: AMB.  LOCATION: [place]                    [facility]  PHYSICIAN:  Lionel December, M.D.    DATE OF BIRTH:  1966-03-07  DATE: 12/29/2010.                               OFFICE VISIT.   PRESENTING COMPLAINT:  Followup for chronic pancreatitis and chronic liver disease.  SUBJECTIVE:  [name] [name] is a 45 year old African American male patient of Dr. Olena Leatherwood who is here for scheduled visit.  He was last seen 13 months ago.  He is accompanied by his wife Arline Asp.  He states he feels about the same.  He remains with chronic upper abdominal pain.  In addition to going to the Pain Clinic in [place], he is also seeing a specialist in [place] and to have some kind of nerve block 3 days from now.  He states pain medicine only helps to a degree.  He is also taking 5-6 BC powders every night for headache and abdominal pain.  He says his appetite is fair.  He has gained 5 pounds since his last visit.  He generally has 2-3 formed stools per day.  He denies melena or rectal bleeding.  He has intermittent pruritus and is taking Benadryl on p.r.n. basis.  CURRENT MEDICATIONS: 1. Ramipril 5 mg daily. 2. Omeprazole 20 mg p.o. q.a.m. 3. Januvia 100 mg daily. 4. Urso 500 mg p.o. daily. 5. Gabapentin 600 mg 3 times a day. 6. Gemfibrozil 600 mg b.i.d. 7. Tizanidine 2 mg in a.m. and 4 mg at bedtime. 8. Bupropion 300 mg daily. 9. Creon 24,000 one with each meal. 10.Hydroxyzine 25 mg b.i.d. 11.Cilostazol 50 mg p.o. b.i.d. 12.Lantus 25 units twice daily. 13.NovoLog via sliding scale that need coverage with meals. 14.Oxycodone 15 mg 4 times a day.  OBJECTIVE:  VITAL SIGNS:  Weight 145 pounds, he is 65 inches tall, pulse 68 per minute, blood pressure 110/70 and temp is 97.6. EYES:  Conjunctivae are pink.  Sclerae are nonicteric. MOUTH:  Oropharyngeal mucosa is normal.  He has  2 teeth in the lower jaw.  He has dentures that he does not wear.  He has upper dentures. NECK:  No neck masses or thyromegaly noted. ABDOMEN:  Somewhat asymmetric with prominence in the right mid abdomen. However, he does not have any cough impulse.  He has a midline scar. His liver edge is 3-4 cm below ostium, it is firm and nontender.  Spleen is not palpable. EXTREMITIES:  He does not have peripheral edema or clubbing.  ASSESSMENT: 1. 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  This was performed at     [facility]. 2. 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  He has had elevated alkaline phosphatase in the     past.  He is due for his lab studies.  He is also due for screening     both for pancreatic and pancreatic CA and HCC.  PLAN:  The patient advised to stop taking BCs.  He will have CBC, Chem-20 and alpha-fetoprotein.  We also scheduled for abdominopelvic CT with contrast.     Lionel December, M.D. NR/MEDQ  D:  12/29/2010  T:  12/29/2010  Job:  [id number]  cc:   Lia Hopping Fax: [phone]  Electronically Signed by Lionel December M.D. on 01/10/2011 01:17:48 PM

## 2011-01-19 LAB — POCT CARDIAC MARKERS
CKMB, poc: 2.3 ng/mL (ref 1.0–8.0)
Troponin i, poc: <0.05 ng/mL (ref 0.00–0.09)

## 2011-01-19 LAB — BASIC METABOLIC PANEL
BUN: 19 mg/dL (ref 6–23)
BUN: 25 mg/dL — ABNORMAL HIGH (ref 6–23)
BUN: 28 mg/dL — ABNORMAL HIGH (ref 6–23)
BUN: 36 mg/dL — ABNORMAL HIGH (ref 6–23)
BUN: 37 mg/dL — ABNORMAL HIGH (ref 6–23)
BUN: 5 mg/dL — ABNORMAL LOW (ref 6–23)
CO2: 21 mEq/L (ref 19–32)
CO2: 22 mEq/L (ref 19–32)
CO2: 24 mEq/L (ref 19–32)
CO2: 25 mEq/L (ref 19–32)
CO2: 26 mEq/L (ref 19–32)
CO2: 27 mEq/L (ref 19–32)
CO2: 27 mEq/L (ref 19–32)
CO2: 7 mEq/L — CL (ref 19–32)
Calcium: 10.5 mg/dL (ref 8.4–10.5)
Calcium: 8.3 mg/dL — ABNORMAL LOW (ref 8.4–10.5)
Calcium: 8.3 mg/dL — ABNORMAL LOW (ref 8.4–10.5)
Calcium: 8.6 mg/dL (ref 8.4–10.5)
Calcium: 8.8 mg/dL (ref 8.4–10.5)
Calcium: 9.5 mg/dL (ref 8.4–10.5)
Calcium: 9.9 mg/dL (ref 8.4–10.5)
Chloride: 109 mEq/L (ref 96–112)
Chloride: 110 mEq/L (ref 96–112)
Chloride: 111 mEq/L (ref 96–112)
Chloride: 111 mEq/L (ref 96–112)
Chloride: 112 mEq/L (ref 96–112)
Chloride: 112 mEq/L (ref 96–112)
Chloride: 112 mEq/L (ref 96–112)
Chloride: 113 mEq/L — ABNORMAL HIGH (ref 96–112)
Chloride: 113 mEq/L — ABNORMAL HIGH (ref 96–112)
Chloride: 113 mEq/L — ABNORMAL HIGH (ref 96–112)
Chloride: 90 mEq/L — ABNORMAL LOW (ref 96–112)
Creatinine, Ser: 0.76 mg/dL (ref 0.4–1.5)
Creatinine, Ser: 0.76 mg/dL (ref 0.4–1.5)
Creatinine, Ser: 0.84 mg/dL (ref 0.4–1.5)
Creatinine, Ser: 1.47 mg/dL (ref 0.4–1.5)
GFR calc Af Amer: 27 mL/min — ABNORMAL LOW (ref >60)
GFR calc Af Amer: 56 mL/min — ABNORMAL LOW (ref >60)
GFR calc Af Amer: 59 mL/min — ABNORMAL LOW (ref >60)
GFR calc Af Amer: >60 mL/min (ref >60)
GFR calc Af Amer: >60 mL/min (ref >60)
GFR calc Af Amer: >60 mL/min (ref >60)
GFR calc non Af Amer: 22 mL/min — ABNORMAL LOW (ref >60)
GFR calc non Af Amer: 33 mL/min — ABNORMAL LOW (ref >60)
GFR calc non Af Amer: 57 mL/min — ABNORMAL LOW (ref >60)
GFR calc non Af Amer: >60 mL/min (ref >60)
GFR calc non Af Amer: >60 mL/min (ref >60)
GFR calc non Af Amer: >60 mL/min (ref >60)
GFR calc non Af Amer: >60 mL/min (ref >60)
Glucose, Bld: 1090 mg/dL (ref 70–99)
Glucose, Bld: 112 mg/dL — ABNORMAL HIGH (ref 70–99)
Glucose, Bld: 126 mg/dL — ABNORMAL HIGH (ref 70–99)
Glucose, Bld: 193 mg/dL — ABNORMAL HIGH (ref 70–99)
Glucose, Bld: 424 mg/dL — ABNORMAL HIGH (ref 70–99)
Glucose, Bld: 866 mg/dL (ref 70–99)
Glucose, Bld: 95 mg/dL (ref 70–99)
Potassium: 3.2 mEq/L — ABNORMAL LOW (ref 3.5–5.1)
Potassium: 3.4 mEq/L — ABNORMAL LOW (ref 3.5–5.1)
Potassium: 3.4 mEq/L — ABNORMAL LOW (ref 3.5–5.1)
Potassium: 3.5 mEq/L (ref 3.5–5.1)
Potassium: 3.5 mEq/L (ref 3.5–5.1)
Potassium: 3.5 mEq/L (ref 3.5–5.1)
Potassium: 3.5 mEq/L (ref 3.5–5.1)
Potassium: 3.5 mEq/L (ref 3.5–5.1)
Potassium: 3.6 mEq/L (ref 3.5–5.1)
Potassium: 4.1 mEq/L (ref 3.5–5.1)
Potassium: 4.7 mEq/L (ref 3.5–5.1)
Potassium: 4.9 mEq/L (ref 3.5–5.1)
Potassium: 4.9 mEq/L (ref 3.5–5.1)
Sodium: 135 mEq/L (ref 135–145)
Sodium: 138 mEq/L (ref 135–145)
Sodium: 141 mEq/L (ref 135–145)
Sodium: 142 mEq/L (ref 135–145)
Sodium: 143 mEq/L (ref 135–145)
Sodium: 145 mEq/L (ref 135–145)
Sodium: 145 mEq/L (ref 135–145)
Sodium: 146 mEq/L — ABNORMAL HIGH (ref 135–145)
Sodium: 147 mEq/L — ABNORMAL HIGH (ref 135–145)
Sodium: 149 mEq/L — ABNORMAL HIGH (ref 135–145)
Sodium: 151 mEq/L — ABNORMAL HIGH (ref 135–145)
Sodium: 153 mEq/L — ABNORMAL HIGH (ref 135–145)

## 2011-01-19 LAB — DIFFERENTIAL
Basophils Absolute: 0.1 10*3/uL (ref 0.0–0.1)
Basophils Absolute: 0.1 10*3/uL (ref 0.0–0.1)
Basophils Relative: 0 % (ref 0–1)
Basophils Relative: 1 % (ref 0–1)
Basophils Relative: 1 % (ref 0–1)
Eosinophils Absolute: 0.2 10*3/uL (ref 0.0–0.7)
Eosinophils Absolute: 0.3 10*3/uL (ref 0.0–0.7)
Eosinophils Relative: 0 % (ref 0–5)
Eosinophils Relative: 3 % (ref 0–5)
Eosinophils Relative: 5 % (ref 0–5)
Lymphocytes Relative: 20 % (ref 12–46)
Lymphs Abs: 1.3 10*3/uL (ref 0.7–4.0)
Monocytes Absolute: 0.6 10*3/uL (ref 0.1–1.0)
Monocytes Absolute: 1.2 10*3/uL — ABNORMAL HIGH (ref 0.1–1.0)
Monocytes Relative: 8 % (ref 3–12)
Monocytes Relative: 9 % (ref 3–12)
Neutro Abs: 4.6 10*3/uL (ref 1.7–7.7)
Neutro Abs: 6.2 10*3/uL (ref 1.7–7.7)
Neutrophils Relative %: 69 % (ref 43–77)

## 2011-01-19 LAB — GLUCOSE, CAPILLARY
Glucose-Capillary: 121 mg/dL — ABNORMAL HIGH (ref 70–99)
Glucose-Capillary: 122 mg/dL — ABNORMAL HIGH (ref 70–99)
Glucose-Capillary: 123 mg/dL — ABNORMAL HIGH (ref 70–99)
Glucose-Capillary: 127 mg/dL — ABNORMAL HIGH (ref 70–99)
Glucose-Capillary: 136 mg/dL — ABNORMAL HIGH (ref 70–99)
Glucose-Capillary: 147 mg/dL — ABNORMAL HIGH (ref 70–99)
Glucose-Capillary: 148 mg/dL — ABNORMAL HIGH (ref 70–99)
Glucose-Capillary: 153 mg/dL — ABNORMAL HIGH (ref 70–99)
Glucose-Capillary: 162 mg/dL — ABNORMAL HIGH (ref 70–99)
Glucose-Capillary: 169 mg/dL — ABNORMAL HIGH (ref 70–99)
Glucose-Capillary: 175 mg/dL — ABNORMAL HIGH (ref 70–99)
Glucose-Capillary: 191 mg/dL — ABNORMAL HIGH (ref 70–99)
Glucose-Capillary: 199 mg/dL — ABNORMAL HIGH (ref 70–99)
Glucose-Capillary: 233 mg/dL — ABNORMAL HIGH (ref 70–99)
Glucose-Capillary: 237 mg/dL — ABNORMAL HIGH (ref 70–99)
Glucose-Capillary: 257 mg/dL — ABNORMAL HIGH (ref 70–99)
Glucose-Capillary: 266 mg/dL — ABNORMAL HIGH (ref 70–99)
Glucose-Capillary: 267 mg/dL — ABNORMAL HIGH (ref 70–99)
Glucose-Capillary: 302 mg/dL — ABNORMAL HIGH (ref 70–99)
Glucose-Capillary: 305 mg/dL — ABNORMAL HIGH (ref 70–99)
Glucose-Capillary: 394 mg/dL — ABNORMAL HIGH (ref 70–99)
Glucose-Capillary: 444 mg/dL — ABNORMAL HIGH (ref 70–99)
Glucose-Capillary: 75 mg/dL (ref 70–99)
Glucose-Capillary: 92 mg/dL (ref 70–99)
Glucose-Capillary: 96 mg/dL (ref 70–99)
Glucose-Capillary: >600 mg/dL (ref 70–99)

## 2011-01-19 LAB — BLOOD GAS, ARTERIAL
Bicarbonate: 28.5 mEq/L — ABNORMAL HIGH (ref 20.0–24.0)
Bicarbonate: 4.3 mEq/L — ABNORMAL LOW (ref 20.0–24.0)
FIO2: 21 %
FIO2: 21 %
FIO2: 21 %
O2 Saturation: 94.9 %
Patient temperature: 37
TCO2: 24.9 mmol/L (ref 0–100)
TCO2: 4 mmol/L (ref 0–100)
pCO2 arterial: 12.8 mmHg — CL (ref 35.0–45.0)
pCO2 arterial: 22.2 mmHg — ABNORMAL LOW (ref 35.0–45.0)
pH, Arterial: 7.147 — CL (ref 7.350–7.450)
pH, Arterial: 7.246 — ABNORMAL LOW (ref 7.350–7.450)
pH, Arterial: 7.404 (ref 7.350–7.450)
pO2, Arterial: 106 mmHg — ABNORMAL HIGH (ref 80.0–100.0)

## 2011-01-19 LAB — CBC
HCT: 31.5 % — ABNORMAL LOW (ref 39.0–52.0)
HCT: 33.5 % — ABNORMAL LOW (ref 39.0–52.0)
HCT: 45.8 % (ref 39.0–52.0)
Hemoglobin: 10.8 g/dL — ABNORMAL LOW (ref 13.0–17.0)
Hemoglobin: 11.4 g/dL — ABNORMAL LOW (ref 13.0–17.0)
Hemoglobin: 15 g/dL (ref 13.0–17.0)
MCHC: 32.8 g/dL (ref 30.0–36.0)
MCHC: 33.9 g/dL (ref 30.0–36.0)
MCHC: 34.1 g/dL (ref 30.0–36.0)
MCV: 87.6 fL (ref 78.0–100.0)
Platelets: 166 10*3/uL (ref 150–400)
Platelets: 244 10*3/uL (ref 150–400)
RBC: 3.82 MIL/uL — ABNORMAL LOW (ref 4.22–5.81)
RBC: 4.65 MIL/uL (ref 4.22–5.81)
RBC: 5.06 MIL/uL (ref 4.22–5.81)
RDW: 14.2 % (ref 11.5–15.5)
RDW: 14.4 % (ref 11.5–15.5)
WBC: 6.6 10*3/uL (ref 4.0–10.5)

## 2011-01-19 LAB — HEPATIC FUNCTION PANEL
ALT: 19 U/L (ref 0–53)
Bilirubin, Direct: <0.1 mg/dL (ref 0.0–0.3)
Total Bilirubin: 1.7 mg/dL — ABNORMAL HIGH (ref 0.3–1.2)

## 2011-01-19 LAB — URINALYSIS, ROUTINE W REFLEX MICROSCOPIC
Ketones, ur: 40 mg/dL — AB
Nitrite: NEGATIVE
Urobilinogen, UA: 0.2 mg/dL (ref 0.0–1.0)
pH: 5.5 (ref 5.0–8.0)

## 2011-01-19 LAB — URINE CULTURE
Colony Count: NO GROWTH
Culture: NO GROWTH

## 2011-01-19 LAB — RAPID URINE DRUG SCREEN, HOSP PERFORMED
Barbiturates: NOT DETECTED
Cocaine: NOT DETECTED
Opiates: NOT DETECTED
Tetrahydrocannabinol: NOT DETECTED

## 2011-01-19 LAB — HIV 1/2 CONFIRMATION: HIV-2 Ab: NEGATIVE

## 2011-01-19 LAB — TRIGLYCERIDES: Triglycerides: 1298 mg/dL — ABNORMAL HIGH (ref <150)

## 2011-01-19 LAB — T-HELPER CELLS (CD4) COUNT (NOT AT ARMC): CD4 % Helper T Cell: 47 % (ref 33–55)

## 2011-01-19 LAB — URINE MICROSCOPIC-ADD ON

## 2011-02-01 ENCOUNTER — Encounter (INDEPENDENT_AMBULATORY_CARE_PROVIDER_SITE_OTHER): Payer: Medicare Other | Admitting: Internal Medicine

## 2011-02-01 ENCOUNTER — Ambulatory Visit (HOSPITAL_COMMUNITY): Admission: RE | Admit: 2011-02-01 | Payer: Medicare Other | Source: Ambulatory Visit | Admitting: Internal Medicine

## 2011-02-02 ENCOUNTER — Ambulatory Visit: Payer: Self-pay | Admitting: Pain Medicine

## 2011-02-05 ENCOUNTER — Encounter (INDEPENDENT_AMBULATORY_CARE_PROVIDER_SITE_OTHER): Payer: Medicare Other | Admitting: Internal Medicine

## 2011-02-16 NOTE — Consult Note (Signed)
NAMERESHAWN, OSTLUND              ACCOUNT NO.:  [id number]   MEDICAL RECORD NO.:  [id number]          PATIENT TYPE:  INP   LOCATION:  IC07                          FACILITY:  [facility]   PHYSICIAN:  Kassie Mends, M.D.      DATE OF BIRTH:  07/17/1966   DATE OF CONSULTATION:  11/23/2008  DATE OF DISCHARGE:                                 CONSULTATION   PRIMARY PHYSICIAN:  Dr. Olena Leatherwood.   REFERRING PHYSICIAN:  Dr. Skeet Latch.   REASON FOR CONSULTATION:  Pancreatitis.   HISTORY OF PRESENT ILLNESS:  Mr. Goldie is a 45 year old male who has a  history of chronic pancreatitis and elevated liver enzymes.  His liver  enzymes have been elevated since March 2003.  The alkaline phosphatase  has ranged from 262 to 828.  He presented with weight loss over the last  2 months.  Yesterday, he began to vomit all day.  His wife was sick  within the last 72 hours as well.  There was no blood in his vomit.  He  began to have mental status changes yesterday.  He became incoherent,  could not walk, and so this morning she called 9-1-1.  He is unable to  give any history, so the history is obtained from his wife.  He has not  been complaining of headaches.  He has had worsening of vision over the  last 1-2 months.  He has had problems with anterior and posterior nasal  drainage.  She is not sure of the color.  He had no problems with nausea  or vomiting prior to yesterday.  His appetite has been decreased for 5  years but worse over the last 2 months.  She is not aware of any black  tarry stools or diarrhea.  He did ask for an enema for constipation  recently.  He has seen Dr. Karilyn Cota and Dr. Olena Leatherwood for his chronic weight  loss and elevated liver enzymes.  His previous PCP was Dr. Lodema Hong.  Yesterday, he would cough and then vomit.  He did not complain of  shortness of breath or chest pain.  He was complaining of lower  abdominal pain and pain in the middle of his abdomen which he describes  as his  pancreatitis pain.   PAST MEDICAL HISTORY:  1. Diabetes.  2. Chronic pancreatitis.  3. Hyperlipidemia.  4. Anxiety.  5. Cardiomyopathy.  6. Peripheral neuropathy.  7. Hypertension.  8. History of alcohol abuse.   PAST SURGICAL HISTORY:  1. Laparoscopic cholecystectomy.  2. Choledochoduodenostomy.   FAMILY HISTORY:  His uncle had colon cancer.  He had a sister who died  of cancer of unknown primary.  No family history of polyps.   SOCIAL HISTORY:  He lives with his current wife for 20 years before he  married her.  They have been married since 2001.  He has no known  history of IV drug abuse.  She is not aware of his using any cocaine.  He quit drinking alcohol 6 years ago.   REVIEW OF SYSTEMS:  Limited to the history obtained from his wife.  Otherwise, all systems are negative.   PHYSICAL EXAMINATION:  Blood pressure 121/78, heart rate 129, pulse 92,  O2 saturation 92%.  GENERAL:  He is in no apparent distress but unable  to answer questions.  He does move around in the bed spontaneously.  He  is cachectic. HEENT EXAM:  Is atraumatic, normocephalic.  Pupils equal  and reactive to light.  The patient would not corporate with the oral  exam.  NECK:  Has full range of motion.  No lymphadenopathy.  LUNGS:  Clear to auscultation bilaterally.  CARDIOVASCULAR EXAM:  Reveals a  regular rhythm, no murmur, normal S1-S2.  ABDOMEN:  Bowel sounds are  present and soft, nontender, nondistended, mild tenderness to palpation  in THE epigastrium with deep palpation.  No rebound or guarding.  EXTREMITIES:  Have no cyanosis or edema.  NEUROLOGICAL:  He does not  appear to have any focal neurologic deficits.   RADIOGRAPHIC STUDIES:  Chest x-ray:  No acute cardiopulmonary disease.   LABORATORY DATA:  pH 7.24, pCO2 22, pO2 106.  White count 13, hemoglobin  15, platelets 356.  Sodium 138, potassium 4.9, glucose 866, creatinine  3.07.  Alkaline phosphatase 403, AST is 12, ALT of 19, albumin 2.9  with  a calcium of 10.4.  Urine drug screen negative.  Alcohol level less than  5.  UA shows a glucose greater than 1000, leukocytes negative, nitrites  negative, bacteria many.   ASSESSMENT:  Mr. Dinapoli is a 45 year old male who has acute onset of  vomiting which likely causes diabetic ketoacidosis.  The vomiting may be  secondary to a viral illness or exacerbation of his chronic  pancreatitis.  He has no explanation for his chronic weight loss.  His  last abdominal imaging was in November 2008 which showed chronic  occlusion of the splenic vein, pneumobilia likely secondary to his  choledochoduodenostomy, and constipation.   Thank you for allowing me to see Mr. Slatter in consultation.  My  recommendations follow.   RECOMMENDATIONS:  1. Aggressive hydration.  2. Would perform a CT scan of the chest, abdomen, pelvis and head to      evaluate his mental status changes and weight loss.  3. Consider checking a CD-4 count.  4. Add triglycerides to the blood in the lab.  5. Protonix for GI prophylaxis.  6. Would continue his Actigall once his mental status changes have      resolved.  He is likely on that for chronic cholestatic liver      disease.      Kassie Mends, M.D.  Electronically Signed     SM/MEDQ  D:  11/23/2008  T:  11/23/2008  Job:  [id number]   cc:   Lia Hopping  Fax: [phone]

## 2011-02-16 NOTE — Group Therapy Note (Signed)
[name] [name], [name] [name]              ACCOUNT NO.:  [id number]   MEDICAL RECORD NO.:  [id number]          PATIENT TYPE:  INP   LOCATION:  IC07                          FACILITY:  [facility]   PHYSICIAN:  Skeet Latch, DO    DATE OF BIRTH:  [birth year]   DATE OF PROCEDURE:  11/26/2008  DATE OF DISCHARGE:                                 PROGRESS NOTE   SUBJECTIVE:  [name] [name] is a 45 year old African American male with a  history of chronic pancreatitis, elevated LFTs, and weight loss over the  last few months who presented incoherent, mental status changes.  Patient was found to be in DKA and started on Glucommander.  Secondary  to the chronic pancreatitis and elevated enzymes, gastroenterology was  consulted.  Secondary to his weight loss over the last few months,  patient had a CT of his head, abdomen, and chest performed which was  fairly unremarkable.  Today, patient is more alert and awake.  Blood  sugars have returned to a reasonable range.  Patient will be transferred  to the __________bed.   OBJECTIVE:  VITAL SIGNS:  Temperature is 98.1.  Pulse 76.  Respirations  14.  Blood pressure 121/86.  He is saturating 98% on room air.  CARDIOVASCULAR:  Regular rate and rhythm.  No murmurs, rubs, or gallops.  LUNGS:  Clear to auscultation bilaterally.  No rales, rhonchi, or  wheeze.  ABDOMEN:  Soft, nontender, nondistended.  Positive bowel sounds.  EXTREMITIES:  No clubbing, cyanosis, or edema.  GENERAL:  Patient is cachectic.  He is more alert and awake, answering  questions appropriately.   LABS:  White count 7500, hemoglobin 11.4, hematocrit 33.5, platelet  count 202,000, sodium 143, potassium 3.5, chloride 112, CO2 is 26,  glucose 206, BUN 9, creatinine 0.76.  CD4 count was 720.   ASSESSMENT AND PLAN:  1. Diabetic ketoacidosis.  Patient's blood sugars are within normal      range at this time.  Patient no longer on Glucommander.  Patient is      on sliding scale and is on Lantus and  Januvia for his blood sugars.  2. Chronic pancreatitis.  Patient is on oral Roxicodone at this time,      being adjusted by pharmacy.  Patient's MRI of his abdomen shows the      abdomen has __________left hepatic lobe __________most likely it      was fatty infiltration.  They recommended followup MRI in 6 months.  3. For his anxiety, hyperlipidemia, and other chronic conditions,      patient will continue on his home medications.  We will get a      nutrition consult at this time prior to patient being discharged.      Once nutrition sees the patient and patient's blood sugars are      stable, he probably can be discharged in the next 24 hours if      stable.      Skeet Latch, DO  Electronically Signed     SM/MEDQ  D:  11/26/2008  T:  11/26/2008  Job:  [id number]

## 2011-02-16 NOTE — H&P (Signed)
[name] [name], [name] [name]              ACCOUNT NO.:  [id number]   MEDICAL RECORD NO.:  [id number]          PATIENT TYPE:  INP   LOCATION:  A340                          FACILITY:  [facility]   PHYSICIAN:  Skeet Latch, DO    DATE OF BIRTH:  02-Sep-1966   DATE OF ADMISSION:  11/23/2008  DATE OF DISCHARGE:  LH                              HISTORY & PHYSICAL   PRIMARY CARE PHYSICIAN:  Dr. Olena Leatherwood   CHIEF COMPLAINT:  Elevated blood sugars.   This is a 45 year old African American male who presented with high  blood sugars.  History is provided by spouse who states that she noticed  the patient started having some mental status changes.  The patient has  a history of chronic pancreatitis and had increasing  pain for last few  days; he ran out of his medications.  Blood sugars are usually in the  200 to 300 range.  EMS was   Dictation ended at this point.      Skeet Latch, DO  Electronically Signed     SM/MEDQ  D:  11/27/2008  T:  11/27/2008  Job:  [id number]

## 2011-02-16 NOTE — H&P (Signed)
NAMEGLADSTONE, [name] [name]              ACCOUNT NO.:  [id number]   MEDICAL RECORD NO.:  [id number]          PATIENT TYPE:  INP   LOCATION:  A340                          FACILITY:  [facility]   PHYSICIAN:  Skeet Latch, DO    DATE OF BIRTH:  1966/09/30   DATE OF ADMISSION:  11/23/2008  DATE OF DISCHARGE:  LH                              HISTORY & PHYSICAL   CONTINUATION:   HISTORY OF PRESENT ILLNESS:  The patient had elevated blood sugars.  EMS  was called. It was too high for their machine to read it. The spouse  states that the patient became incoherent and could not walk so again  911 was called.   PAST MEDICAL HISTORY:  Chronic pancreatitis, diabetes, hyperlipidemia,  anxiety, cardiomyopathy, peripheral neuropathy, hypertension, history of  alcohol abuse.   PAST SURGICAL HISTORY:  Cholecystectomy, coloduodenostomy.   FAMILY HISTORY:  Positive for colon cancer, history of colon polyps.   SOCIAL HISTORY:  The patient has lived with his wife for 20 years.  No  history of IV drug abuse. Denies history of alcohol abuse.   REVIEW OF SYSTEMS:  Unobtainable.   PHYSICAL EXAMINATION:  VITAL SIGNS:  Initially showed heart rate of 129,  pulse 92, blood pressure 121/78.  GENERAL:  He was in no acute distress.  He did not answer questions.  Spontaneous movements but the patient is cachectic.  HEENT:  Head is normocephalic, atraumatic.  Pupils equal, round,  reactive to light and accommodation.  NECK:  Soft, supple,  nontender, nondistended.  LUNGS:  Clear to auscultation bilaterally.  No rales, rhonchi or  wheezing.  CARDIOVASCULAR:  Regular rate and rhythm.  No murmurs, rubs or gallops.  ABDOMEN:  Soft, nontender, nondistended.  No rebound or guarding.  EXTREMITIES:  No clubbing, cyanosis or edema.  NEUROLOGIC:  No tremors. No focal deficits were noted.   Chest x-ray showed:  1). Stable mild chronic bronchitic changes; 2).  Interval probable nipple shadow on the left. Followup PA and  lateral  views where nipple markers were recommended. 3).  No acute  abnormalities.   LABORATORY DATA:  White blood cell count 13,000, hemoglobin 15.0,  hematocrit 45.8, platelet count 356,000.  Myoglobin 256, troponin less  than 0.05.  CK MB 2.3.  ABG:  pH- 7.147, pCO2 12.8, pO2 124, bicarb 4.3.  Alcohol level is less than 5, lipase 167.  Sodium 135, potassium 4.9,  chloride 90, CO2 7, glucose 1090, BUN 41, creatinine 3.30, alkaline  phosphatase 407, AST 12, ALT 19, albumin 2.9. Urinalysis no nitrates or  leukocytes, moderate blood, small amount of bilirubin positive for  glucose.  Urine drug screen was unremarkable.   ASSESSMENT AND PLAN:  1. Diabetic ketoacidosis.  For this, the patient will be started on      Glucommander per protocol at this time.  Will add potassium to his      IV fluids and will change to D5 1/2 normal when sugars are below      the 250 range.  Then the patient will be placed on sliding scale      and Lantus  will be added.  2. History of chronic pancreatitis.  Will consult gastroenterology      regarding any recommendations they might have and will IV hydrate      the patient aggressively at this time.  3. Cachexia and weight loss per wife's history.  Will get a CD4 count      and the patient may need CT scan of his abdomen, chest and pelvis      to rule out any malignant type processes at this time.  4. The patient will be on DVT as well as GI prophylaxis.      Skeet Latch, DO  Electronically Signed     SM/MEDQ  D:  11/27/2008  T:  11/27/2008  Job:  [id number]

## 2011-02-16 NOTE — Discharge Summary (Signed)
NAMEKENSTON, LONGTON              ACCOUNT NO.:  [id number]   MEDICAL RECORD NO.:  [id number]          PATIENT TYPE:  INP   LOCATION:  A340                          FACILITY:  [facility]   PHYSICIAN:  Osvaldo Shipper, MD     DATE OF BIRTH:  Sep 13, 1966   DATE OF ADMISSION:  11/23/2008  DATE OF DISCHARGE:  02/24/2010LH                               DISCHARGE SUMMARY   PRIMARY CARE PHYSICIAN:  Dr. Lia Hopping in [place], [place].   He is also followed by Lionel December, M.D. of gastroenterology and by  [facility] Clinic.   DISCHARGE DIAGNOSES:  1. Diabetic ketoacidosis, resolved.  2. Chronic abdominal pain.  3. Transient hematuria likely secondary to Foley catheter, resolved.  4. Poorly controlled diabetes.  5. Hypertension.   Please review H and P dictated by Dr. Lilian Kapur for details regarding the  patient's presenting illness.   BRIEF HOSPITAL COURSE:  Briefly, this is a 45 year old African American  male who presented to the hospital with complaints of abdominal pain.  He was found to have elevated blood sugar and was found to be in DKA.   1. DKA.  The patient was started on an insulin drip.  He was kept NPO.      Slowly his blood sugars improved.  His acidosis corrected and the      patient was transitioned to Lantus.  The patient's blood sugars      have been reasonably controlled over the last 2 days.  2. Abdominal pain.  The patient has a history of chronic pancreatitis.      He was seen by gastroenterology during this admission.  He      underwent CT of his abdomen and pelvis which revealed a possible      hepatic lesion.  This prompted an MRI of the abdomen which revealed      that this lesion was probably fatty infiltration, but a close      follow-up MRI in 6 months was recommended.  His abdominal pain as      of now is back to his baseline.  He does take narcotics at home and      he is followed by a pain clinic specialist.  3. The other imaging studies that he underwent  include a CT head and      CT chest because of weight loss and both of these were negative.  4. He was also quite dehydrated with acute renal failure and with IV      hydration that has also resolved.  He has pretty severe      uncontrolled diabetes with an HBA1c of 13.8 and a mean plasma      glucose of 349.  He tells me that he takes 70 units of Lantus at      home, but currently he is requiring only about 20 units b.i.d., so      I suspect that his compliance is an issue here and that could be      the reason why he tipped over into DKA.  He will be sent home on 25  units b.i.d.  5. Other tests pending include HIV test which was done but is still      pending.  He also has mild anemia which is also stable.   On the day of discharge, the patient is feeling quite well.  He is keen  on going home.   DISCHARGE PHYSICAL EXAMINATION:  VITAL SIGNS:  His temperature is 98.4,  heart rate 71, respiratory rate 22, blood pressure 125/74, saturations  96% on room air.  LUNGS:  Clear to auscultation bilaterally.  CARDIOVASCULAR:  S1 and S2 is normal regular, no murmurs appreciated.  ABDOMEN:  Soft, nontender and nondistended.  EXTREMITIES:  No edema.   DISCHARGE LABORATORY DATA:  His BMET and CBC are quite stable.   He denies anymore hematuria.   DISCHARGE MEDICATIONS:  1. Lantus 25 units twice daily.  2. Cilostazol 15 mg twice daily.  3. Xanax as needed as before.  4. Roxicodone 30 mg IR four times a day.  5. Ursodiol 300 mg b.i.d.  6. Gabapentin 300 mg three times a day.  7. Gemfibrozil 600 mg b.i.d.  8. Lansoprazole 30 mg daily.  9. Simvastatin 40 mg daily.  10.Pancrease MT three times a day.  11.Ramipril 5 mg daily.  12.Bupropion XL unknown dose daily.  13.Citalopram 20 mg daily.  14.Tizanidine 2 mg in the morning and 4 mg at bedtime.  15.Januvia 100 mg daily.   RECOMMENDED STUDIES:  1. In 6 months MRI of abdomen which Dr. Karilyn Cota will have to arrange.  2. Repeat UA to be  done by PMD to follow up  on his hematuria which      was most likely related to Foley catheter.  3. Follow up with Dr. Olena Leatherwood in 1 week and with Dr. Karilyn Cota in 2-4      weeks.   DIET:  As before.   ACTIVITY:  As before.   Total time on this discharge encounter was 35 minutes.      Osvaldo Shipper, MD  Electronically Signed     GK/MEDQ  D:  11/27/2008  T:  11/27/2008  Job:  [id number]   cc:   Lionel December, M.D.  Fax: [phone]   Lia Hopping  Fax: [phone]   Kassie Mends, M.D.  [address]  [place] , [place] [address]

## 2011-02-19 NOTE — Op Note (Signed)
[facility]  Patient:    [name] [name], [name] [name] Visit Number: [id number] MRN: [id number]          Service Type: MED Location: 3A A326 01 Attending Physician:  Dessa Phi Dictated by:   Elpidio Anis, M.D. Proc. Date: 08/29/01 Admit Date:  08/28/2001                             Operative Report  PREOPERATIVE DIAGNOSIS:  Acute and chronic cholecystitis.  POSTOPERATIVE DIAGNOSIS:  Acute and chronic cholecystitis.  OPERATION:  Laparoscopic cholecystectomy  SURGEON:  Elpidio Anis, M.D.  DESCRIPTION OF PROCEDURE:  Under general endotracheal anesthesia, the patients abdomen was prepped and draped in a sterile field.  Supraumbilical incision was made.  A Veress needle was inserted uneventfully. Abdomen was then insufflated with 2 liters of CO2.  Using a Visiport guide, a 10 mm port was placed uneventfully.  Laparoscope was placed, and gallbladder was visualized.  Under video guidance, a 10 mm port and two 5 mm ports were placed in the right upper quadrant.  The patient was positioned in reverse Trendelenburg position.  The gallbladder was grasped by the assistant and positioned.   Cystic duct was dissected, clipped with five clips, and divided. The cystic artery was dissected, clipped with four clips, and divided.  Using electrocautery, the gallbladder was separated from the infrahepatic space without difficulty.  Gallbladder was grasped and retrieved. Copious irrigation of the bed was carried out.  There was no bleeding and no bile leak. Subphrenic and subhepatic spaces were irrigated.  The CO2 was allowed to escape from the abdomen, and then the ports were removed.  The incisions were closed using 0 Dexon on the fascia and staples on the skin.  Because of some skin bleeders, 3-0 nylon was also used at the umbilicus and in the large incision in the right upper quadrant.  Dressings were placed.  The patient was awakened from anesthesia uneventfully,  transferred to a bed, and taken to the post anesthetic care unit. Dictated by:   Elpidio Anis, M.D. Attending Physician:  Dessa Phi DD:  08/29/01 TD:  08/29/01 Job: [id number]

## 2011-02-19 NOTE — H&P (Signed)
NAME:  [name] [name], [name] [name]                        ACCOUNT NO.:  [id number]   MEDICAL RECORD NO.:  [id number]                   PATIENT TYPE:  AMB   LOCATION:  DAY                                  FACILITY:  [facility]   PHYSICIAN:  Jerolyn Shin C. Katrinka Blazing, M.D.                DATE OF BIRTH:  [birth year]   DATE OF ADMISSION:  DATE OF DISCHARGE:                                HISTORY & PHYSICAL   HISTORY OF PRESENT ILLNESS:  [age]-year-old male with a history of  chronic balanitis with phimosis.  He has recurrent episodes of dysuria. He  has had multiple infections.  The patient is scheduled for circumcision.   PAST HISTORY:  The patient has chronically elevated liver function studies,  etiology is unknown.  He has chronic pancreatitis, noninsulin-dependent  diabetes mellitus, depression with anxiety, cardiomyopathy, peripheral  neuropathy, hypertension, and history of alcohol abuse.   PAST SURGICAL HISTORY:  1. Laparoscopic cholecystectomy.  2. Choledochoduodenostomy.   MEDICATIONS:  1. OxyContin 40 mg b.i.d.  2. __________ 0.125 mg a.c. and h.s.  3. Starlix 120 mg t.i.d.  4. Carafate 1 gram a.c. and h.s.  5. Actos 45 mg q.d.  6. Neurontin 300 mg 2 t.i.d.  7. Glipizide 10 mg 2 q.a.m.  8. Niaspan 500 mg 3 q.h.s.  9. Celexa 20 mg daily.  10.      Soma 350 mg q.h.s.  11.      Amitriptyline 50 mg q.h.s.  12.      Prevacid 30 mg b.i.d.  13.      Altace 2.5 mg daily.  14.      __________ SR 150 mg q.a.m.   PHYSICAL EXAMINATION:  VITAL SIGNS:  Blood pressure 124/100, pulse 92,  respirations 20 and weight 156 pounds.  HEENT:  Unremarkable, except for poor dentition.  NECK:  Supple.  No JVD or bruit.  CHEST:  Clear to auscultation.  No rales, rubs, rhonchi or wheezes.  HEART:  Regular rate and rhythm without murmur, gallop or rub.  ABDOMEN:  Moderate distention.  No tenderness.  No masses.  Normoactive  bowel sounds.  GENITALIA:  Normal male.  Uncircumcised phallus with redundant foreskin  and  chronic inflammation of  the glans.    IMPRESSION:  1. Chronic balanitis with phimosis.  2. Chronic alcoholism.  3. Noninsulin-dependent diabetes mellitus.  4. Peripheral neuropathy.  5. Depression with anxiety.  6. Cardiomyopathy.  7. Hyperlipidemia.  8. Hypertension.  9. Chronically elevated liver function tests, type of hepatitis undefined.   PLAN:  Circumcision under spinal anesthesia.                                                 Dirk Dress. Katrinka Blazing, M.D.    LCS/MEDQ  D:  11/13/2002  T:  11/13/2002  Job:  [id number]

## 2011-02-19 NOTE — Op Note (Signed)
[facility]  Patient:    [name] [name], [name] [name] Visit Number: [id number] MRN: [id number]          Service Type: DSU Location: DAY Attending Physician:  [name] [name] Dictated by:   [name] [name], M.D. Proc. Date: 12/06/01 Admit Date:  12/06/2001   CC:         [name] [name], M.D.   Operative Report  PROCEDURE:  Esophagogastroduodenoscopy followed by endoscopic retrograde cholangiopancreatography with biliary stenting under general anesthesia.  ENDOSCOPIST:  [name] [name], M.D.  INDICATIONS:  [name] [name] is a 45 year old African-American male with chronic pancreatitis secondary to prior alcohol abuse who has had recurrent biliary colic.  In association with this, he has had significant fluctuation of his transaminases as well as his alkaline phosphatase.  He is suspected to have common duct obstruction and also he could have choledocholithiasis.  It is felt that he would not be able to undergo procedure with conscious sedation. Prior to ERCP he underwent ERCP looking for esophageal and gastric varices.  INFORMED CONSENT:  The risks and benefits reviewed and informed consent was obtained.  ANESTHESIA:  General.  The patient was intubated.  DESCRIPTION OF PROCEDURE:  The procedure was performed in the OR.  After he was placed under anesthesia and intubated he underwent EGD with Olympus videoscope.  The procedure was performed with him in the supine position. There was some blood in the oropharynx felt to be some trauma from endotracheal intubation.  Mucosa of the esophagus was normal.  No varices were identified.  The squamocolumnar junction was unremarkable.  The stomach was empty and distended very well with insufflation. There were changes of gastritis with granularity, erythema and prominence of the folds, particularly in the gastric body.  No fundal varices were identified.  The angularis and pyloric channel were normal.  Duodenum examination  of the bulb and second portion of the duodenum were normal.  The endoscope was withdrawn.  Endoscopic retrograde cholangiopancreatography:  The patient was turned and placed in the semiprone position.  Therapeutic Olympus video duodenoscope was passed through the oropharynx into the esophagus, stomach, and across the pylorus into the bulb and descending duodenum.  The ampulla of Vater was swollen.  Cannulation was attempted with sphincterotome.  Initially, the pancreatic duct was cannulated. It was felt in its entire length.  Using fluoroscopic images, there were no glaring abnormalities.  There was some difficulty noted in selective cannulation of the bowel which was finally carried out using _____ sphincterotome and guidewire.  The distal segment of the CBD was normal.  There was high grade stricture involving the proximal CBD with upstream dilatation.  There were no filling defects noted.  This stricture was short and very symmetrical.  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  The 10 French 7 cm long CHB stent was placed in the bile duct.  The proximal end of the stent was well above the stricture.  There was prompt flow of contrast and bile into the duodenum as the inner catheter was pulled out.  The endoscope was straightened.  The patient was extubated and brought to recovery in stable condition.  FINAL DIAGNOSIS: 1. No evidence of dysphagia or fundal varices. 2. Gastritis.  Helicobacter pylori will be checked at some point. 3. Preliminary fluoroscopic images revealed normal pancreatic    duct. 4. 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    The biliary system was decompressed by placing 10 French 7 cm  long stent.  PLAN:  The patient will be monitored in the hospital, and if he does well he will be going home.  I plan to bring him back to the office within the next week or two and discuss the next step in his  management which would be either stenting for a year with change every three months or surgical intervention with biliary enteric bypass. Dictated by:   [name] [name], M.D. Attending Physician:  [name] [name] DD:  12/06/01 TD:  12/07/01 Job: [id number]

## 2011-02-19 NOTE — H&P (Signed)
[facility]  Patient:    [name] [name], [name] [name] Visit Number: [id number] MRN: [id number]          Service Type: OUT Location: RAD Attending Physician:  Malissa Hippo Dictated by:   Lionel December, M.D. Adm. Date:  11/29/01   CC:         Syliva Overman, M.D.   History and Physical  PRESENTING COMPLAINT:  Frequent abdominal pain and elevated transaminases.  HISTORY OF PRESENT ILLNESS:  The patient is a 44 year old African-American male whom I initially saw for this problem on November 02, 2001. He had been having excruciating upper abdominal pain, and he had elevated transaminases. Abdominal CT was scheduled, but this had to be postponed because of the patients personal reasons. This was finally performed at [facility]. The bile duct measured 6 to 7 mm, but there was no evidence of intra or extrahepatic dilatation. 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 There was evidence of left nephrolithiasis, calcified splenic artery aneurysm, and the pancreatic outline was indistinct. It was felt that these changes could be suggestive of chronic pancreatitis. There was some stranding of peripancreatic fat.  The patients pain completely resolved. He saw Dr. Lodema Hong on November 22, 2001 and his AST was down to 29, ALT was 20, his amylase was 92, his total bilirubin was 0.9 and AP was 395. The patients wife called me on November 24, 2001 stating that his pain was back and he was doubled over. Prescription was called in for Vicodin and he was asked to go to [facility] lab for blood work. His bilirubin was noted to be 1.3, AP was 157, ALT was 84, his amylase was 76. He is once again feeling better, the pain is now mild. He is on multiple medications which are listed on my previous note. He has not had any fever or chills when he experiences these episodes of pain.  PAST MEDICAL HISTORY:  Please refer to previous note.  SOCIAL  HISTORY:  Please refer to previous note.  FAMILY HISTORY:  Please refer to previous note.  ALLERGIES:  None known.  PHYSICAL EXAMINATION:  OBJECTIVE:  The patient weighs 154 pounds today. He is 5 feet 6 inches tall.  VITAL SIGNS:  Pulse 80 per minute, blood pressure 130/72.  HEENT:  Conjunctivae is pink. Sclerae is nonicteric. No adenopathy.  ABDOMEN:  Symmetrical. Bowel sounds are normal. Palpation reveals mild tenderness midepigastrium. No organomegaly or masses noted.  ASSESSMENT:  The patients symptoms are suggestive of biliary colic. He could have small stones or he could also have a stricture with an intermittent partial obstruction. His transaminases were markedly elevated last January, but they were normal one week ago and up again. Study also suggests gastric varices, which would not be surprising given the long history of ethanol use. 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  RECOMMENDATION:  Will make plans to proceed with the ERCP. Options are reviewed with the patient and this will need to be done under anesthesia. The patient does not want to have this test under conscious sedation and I also feel the same way. Prior to the ERCP he will also have a EGD to look at his esophagus and stomach and document whether or not he has a esophagogastric varices. He will have PT, PTT, bleeding time, ASD, ALT prior to procedure and he also will be have talking held for two minutes. While he was in the office,  I reviewed the potential risks of the procedure, including worsening of his chronic pancreatitis, as well as a bleeding or internal injury. He is agreeable.  Dictated by:   Lionel December, M.D. Attending Physician:  Malissa Hippo DD:  11/29/01 TD:  11/29/01 Job: [id number]

## 2011-02-19 NOTE — H&P (Signed)
[facility]  Patient:    [name] [name], [name] [name] Visit Number: [id number] MRN: [id number]          Service Type: MED Location: 3A A326 01 Attending Physician:  Dessa Phi Dictated by:   Elpidio Anis, M.D. Admit Date:  08/28/2001                           History and Physical  HISTORY OF PRESENT ILLNESS:  A 45 year old male with a history of recurrent abdominal pain, nausea and vomiting.  He has been symptomatic since early October.  He had a HIDA scan which showed nonfunction of the gallbladder.  He has had severe exacerbation over the past five days.  His symptoms are now continuous.  The patient is scheduled for laparoscopic cholecystectomy.  PAST HISTORY: 1. He has a history of severe hemorrhagic pancreatitis with hospitalization    at [facility] in June 2002.  During that time he had acute renal    failure with acute respiratory distress syndrome requiring endotracheal    intubation.  He recovered from that uneventfully. 2. He has had a recent cardiac evaluation by Dr. Tenny Craw of the [facility] group.    He did not have any inducible ischemia. 3. Other medical problems include a history of alcohol abuse, hypertension,    depression with anxiety, diverticulosis, history of colon polyps.  PRESENT MEDICATIONS:  1. TriCor 160 q.d.  2. Starlix 120 t.i.d.  3. Lopid 600 mg b.i.d.  4. Prevacid 30 mg q.d.  5. Multivitamins one q.d.  6. Nexium 40 mg q.d.  7. Neurontin 300 mg two t.i.d.  8. Wellbutrin SR 100 mg q.d.  9. Alprazolam 1 mg t.i.d. 10. Skelaxin 400 mg q.d. 11. Prandin 2 mg t.i.d. 12. Celexa 20 mg q.d. 13. Bentyl p.r.n. 14. Lasix 20 mg q.d.  PHYSICAL EXAMINATION:  VITAL SIGNS:  Blood pressure 118/76, pulse 76, respirations 20, weight 163 pounds.  HEENT:  Unremarkable.  NECK:  Supple without JVD or bruit, there is no adenopathy.  CHEST:  Clear to auscultation; no rales, rubs, rhonchi or wheezes.  HEART:  Regular rate and rhythm  without any murmur, gallop or rub.  ABDOMEN:  Moderate distention, severe epigastric and right upper quadrant tenderness, good active bowel sounds.  EXTREMITIES:  Unremarkable.  No cyanosis, clubbing or edema.  NEUROLOGIC:  No neurologic deficits.  IMPRESSION: 1. Acute and chronic acalculous cholecystitis. 2. Chronic pancreatitis. 3. Non-insulin-dependent diabetes mellitus. 4. History of alcoholism. 5. Depression with anxiety. 6. Peripheral neuropathy. 7. Cardiomyopathy, probably related to alcoholism. 8. Hyperlipidemia. 9. Hypertension.  PLAN:  Laparoscopic cholecystectomy. Dictated by:   Elpidio Anis, M.D. Attending Physician:  Dessa Phi DD:  08/28/01 TD:  08/29/01 Job: [id number]

## 2011-02-19 NOTE — Op Note (Signed)
NAME:  [name] [name], [name] [name]                        ACCOUNT NO.:  [id number]   MEDICAL RECORD NO.:  [id number]                   PATIENT TYPE:  AMB   LOCATION:  DAY                                  FACILITY:  [facility]   PHYSICIAN:  Lionel December, M.D.                 DATE OF BIRTH:  Jun 16, 1966   DATE OF PROCEDURE:  05/04/2002  DATE OF DISCHARGE:  05/04/2002                                 OPERATIVE REPORT   PROCEDURE:  Endoscopic retrograde cholangiopancreatography for biliary stent  removal.   INDICATIONS:  [name] [name] is a 45 year old African-American male who had a biliary  stent placed by me in March of this year for obstructive jaundice secondary  to CBD stricture felt to be due to chronic pancreatitis.  He had surgery  performed by Dr. Rockie Neighbours of [facility].  He had cavernous transformation  of portal vein along with a lot of other collaterals, and he was not able to  do the planned surgery.  He was able to do a side-to-side  choledochoduodenostomy.  He was not able to take the stent out at the time  of surgery.  The patient is therefore here to have the stent removed.  He  feels a lot better.  We checked his LFTs and H. pylori results pending.  The  procedure is reviewed with the patient.  Informed consent is obtained.   PREOPERATIVE MEDICATIONS:  Cetacaine spray for pharyngeal topical  anesthesia, Demerol 50 mg IV, Versed 10 mg IV, and Phenergan 12.5 mg IV.   FINDINGS:  The procedure performed in radiology department.  Dr. Alver Fisher  assisted me with the fluoroscopy.  Fluoroscopy was done prior to passing the  scope, and the stent was still in place.  The patient's vital signs and O2  saturation were monitored during the procedure and remained stable.  The  patient was placed in the semi-prone position and the therapeutic Olympus  video duodenoscope was passed through the oropharynx into the esophagus and  stomach and across the pylorus into the descending duodenum.  The lower end  of the  stent was identified.  It had bile debris along the side port and the  tip.  It was felt to be completely occluded.  There was bile in the duodenum  felt to be coming from the anastomosis.  The stent was caught with a snare  and removed under fluoroscopic control.  The patient tolerated the procedure  well.   FINAL DIAGNOSIS:  Status post removal of biliary endoprosthesis.  The  patient has history of common bile duct stricture, for which he underwent  choledochoduodenostomy a few weeks ago.    RECOMMENDATIONS:  He will be going home and resume his usual medications.  I  will let him know the results of blood tests.  Lionel December, M.D.    NR/MEDQ  D:  05/04/2002  T:  05/10/2002  Job:  [id number]   cc:   Rockie Neighbours, M.D., [facility]   Milus Mallick. Lodema Hong, M.D.

## 2011-02-19 NOTE — Op Note (Signed)
   NAME:  [name] [name], [name] [name]                        ACCOUNT NO.:  [id number]   MEDICAL RECORD NO.:  [id number]                   PATIENT TYPE:  AMB   LOCATION:  DAY                                  FACILITY:  [facility]   PHYSICIAN:  Jerolyn Shin C. Katrinka Blazing, M.D.                DATE OF BIRTH:  [birth year]   DATE OF PROCEDURE:  11/13/2002  DATE OF DISCHARGE:                                 OPERATIVE REPORT   PREOPERATIVE DIAGNOSES:  1. Balanitis.  2. Phimosis.   POSTOPERATIVE DIAGNOSES:  1. Balanitis.  2. Phimosis.   PROCEDURE:  Circumcision.   SURGEON:  Dirk Dress. Katrinka Blazing, M.D.   DESCRIPTION OF PROCEDURE:  Under spinal anesthesia, the genitalia were  prepped and draped in a sterile field.  Inspection of the foreskin revealed  very redundant foreskin with some fissuring of the glans as well as the  proximal foreskin.  A circumferential incision was made about 2 cm proximal  to the corona.  Another circumferential incision was made in the foreskin  about 1-1/2 inches just proximal to this.  The incisions were extended down  through the first layer and the foreskin was excised without difficulty.  Hemostasis was achieved.  Foreskin edges were reapproximated using running  locking 2-0 chromic.  A dressing was placed.  The patient tolerated the  procedure well.  He was transferred to a bed and taken to the postanesthetic  care unit for further monitoring.                                               Dirk Dress. Katrinka Blazing, M.D.    LCS/MEDQ  D:  11/13/2002  T:  11/13/2002  Job:  [id number]

## 2011-02-19 NOTE — H&P (Signed)
NAME:  [name] [name], [name] [name] NO.:  [id number]   MEDICAL RECORD NO.:  [id number]                   PATIENT TYPE:   LOCATION:                                       FACILITY:   PHYSICIAN:  Lionel December, M.D.                 DATE OF BIRTH:   DATE OF ADMISSION:  DATE OF DISCHARGE:                                HISTORY & PHYSICAL   CHIEF COMPLAINT:  The patient needs to have a biliary endoprosthesis  removed.   HISTORY OF PRESENT ILLNESS:  The patient is a 45 year old African American  male who was initially seen by me in January of this year for right upper  quadrant pain and elevated bilirubin and transaminases.  He underwent ERCP  on December 06, 2001, under general anesthesia.  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  He was subsequently sent over to Everardo Beals, M.D., of [facility] for surgical intervention.  He had endoscopic  ultrasound by Dr. Opal Sidles which showed changes of chronic pancreatitis  without neoplasm, splenomegaly, and splenic vein thrombosis.  His bile duct  measured 11.5 mm.  He underwent surgery by Everardo Beals, M.D., on March 10, 2002.  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  Instead, he had side-to-side choledochoduodenostomy.  He was not able to  remove the stent and it had migrated distally.  Postoperatively, the patient  has done well, except he had a wound infection which had to be drained and  this is now healing very nicely.  The patient does not have RUQ pain any  more.  He still has epigastric pain, but he feels  that this is related to  the infection.  Overall he feels much better.  Now he is back on his usual  diet.  He has lost 12 pounds since his last office visit four months ago.  He feels that his weight loss has leveled off.  He has been off his  OxyContin, but has been on Tylox until he ran out of the prescription and  needs a refill.  He has not experienced any fever, chills, nausea, vomiting,  or pruritus.   MEDICATIONS:  He is on multiple medications which are:  1. Carisoprodol 350 mg q.h.s.  2. Carafate 1 g q.i.d.  3. Altace 2.5 mg q.d.  4. Lipram 4500 units three capsules with each meal.  5. Glucotrol 20 mg q.d.  6. Starlix 120 mg t.i.d.  7. Prevacid 30 mg b.i.d.  8. Celexa 20 mg q.d.  9.  Amitriptyline 50 mg q.h.s.  10.      Neurontin 600 mg t.i.d.  11.      Actos 45 mg q.d.  12.      Niaspan 1.5 g q.h.s.  13.      Lipitor 40 mg q.d.  14.      Bentyl 20 mg b.i.d.  15.      Humulin R via sliding scale.  16.      Humulin N 25 units in a.m. and 20 units in p.m.  17.      Tylox one q.4h. p.r.n.  18.      Wellbutrin 150 mg q.d.  19.      MiraLax 17 g q.d.  20.      Xanax 1 mg t.i.d.   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  He has not had any alcohol in over a year now.  He was hospitalized 13  times for pancreatitis.  Other medical problems include hypertension,  diabetes mellitus, depression, and peripheral neuropathy.  He had  cholecystectomy in November of 2002 for cholelithiasis.  He also has chronic  low back pain.   ALLERGIES:  NKA.   FAMILY AND SOCIAL HISTORY:  Summarized in my note of November 02, 2001.   PHYSICAL EXAMINATION:  GENERAL:  A well-developed, thin, African American  male who is in no acute distress.  VITAL SIGNS:  Height 5 feet 6 inches tall, weight 136 pounds.  Pulse 104 per  minute, blood pressure 102/70.  HEENT:  The conjunctivae are pink.  The sclerae are nonicteric.  NECK:  Without masses or  thyromegaly.  CARDIAC:  Regular rhythm.  Normal S1 and S2.  No murmur or gallop noted.  LUNGS:  Clear to auscultation.  ABDOMEN:  Symmetrical.  He has an upper midline scar.  The middle part is  open.  He has gauze in it.  There is no drainage or odor.  The abdomen is  soft.  The liver edge is palpable 2-3 cm below the right costal margin.  He  has mild tenderness in the mid epigastrium, but no definite mass is  palpable.  His spleen is not palpable.  He does not have peripheral edema.   ASSESSMENT:  The patient is side-to-side choledochoduodenostomy performed on  03/10/2002.  He still has at biliary endoprosthesis in place.  This  apparently could not be removed at the time of surgery.  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  He appears to be  having much less pain since his surgery.  Therefore, believe that most of  his pain was due to biliary obstruction.  History of gastritis noted at the  time of endoscopic retrograde cholangiopancreatography.   PLAN:  1. I feel he can stop his Carafate.  He will continue his PPI and pancreatic     enzyme supplement chronically.  2. He will have Helicobacter pylori serology and LFTs prior to ERCP.  3. He will undergo ERCP with removal of biliary endoprosthesis.  I feel this     can be performed with heavy sedation.  I do not intend to inject contrast     into his pancreatic or bile duct.  I do not feel that he needs     antimicrobial prophylaxis.  4. The patient was given prescriptions for Bentyl 20 mg t.i.d., 100 with two  refills, and Tylox one q.6h. p.r.n., 50 without refill.  He will need to     get subsequent prescriptions for narcotics from Dr. Lodema Hong, who is his     primary care physician.                                               Lionel December, M.D.    NR/MEDQ  D:  05/01/2002  T:  05/02/2002  Job:  [id number]  cc:   Dr.  Lodema Hong

## 2011-02-19 NOTE — Discharge Summary (Signed)
[facility]  Patient:    [name], [name] Visit Number: [id number] MRN: [id number]          Service Type: OUT Location: RAD Attending Physician:  Malissa Hippo Dictated by:   Elpidio Anis, M.D. Admit Date:  10/27/2001 Discharge Date: 10/27/2001                             Discharge Summary  DISCHARGE DIAGNOSES: 1. 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 2. Chronic pancreatitis. 3. Non-insulin-dependent diabetes mellitus. 4. Depression with anxiety. 5. Cardiomyopathy. 6. Peripheral neuropathy. 7. Depression with anxiety. 8. Hypertension.  PROCEDURE:  Laparoscopic cholecystectomy on August 29, 2001.  DISPOSITION:  The patient is discharged home in stable, satisfactory condition.  DISCHARGE MEDICATIONS:  1. Tylox two every four hours as needed for pain.  2. Tri-Chlor 160 mg q.d.  3. Starlix 120 mg t.i.d.  4. Lopid 600 mg b.i.d.  5. Prevacid 30 mg q.d.  6. Nexium 40 mg q.d.  7. Neurontin 300 mg t.i.d.  8. Wellbutrin SR 100 mg q.d.  9. Alprazolam 1 mg t.i.d. 10. Skelaxin 400 mg q.i.d. 11. Prandin 2 mg t.i.d. 12. Celexa 20 mg q.d. 13. Bentyl p.r.n. 14. Lasix 20 mg q.p.m.  HISTORY OF PRESENT ILLNESS:  This is a 45 year old male with a history of recurrent abdominal pain, nausea and vomiting.  He has been symptomatic since early October.  He had a HIDA scan that showed nonfunction of the gallbladder. He had severe exacerbation over the five days prior to admission and his symptoms became continuous.  He was admitted for laparoscopic cholecystectomy.  PAST MEDICAL HISTORY:  Given in the admission note.  PHYSICAL EXAMINATION:  VITAL SIGNS:  Afebrile.  ABDOMEN:  Nondistended with severe epigastric and right upper quadrant tenderness.  HOSPITAL COURSE:  He was admitted and started on treatment with IV fluids and analgesics.  Laparoscopic cholecystectomy was done on November 26.  He was found to have acute and chronic  cholecystitis.  Pathology showed acute and chronic cholecystitis.  The patient had an uneventful postoperative course. He remained afebrile with vital signs stable.  Blood pressure was controlled. Blood sugars were controlled.  White count was normal.  He was discharged home on November 27, in stable and satisfactory condition. Dictated by:   Elpidio Anis, M.D. Attending Physician:  Malissa Hippo DD:  10/03/01 TD:  10/03/01 Job: [id number]

## 2011-03-11 ENCOUNTER — Other Ambulatory Visit (INDEPENDENT_AMBULATORY_CARE_PROVIDER_SITE_OTHER): Payer: Self-pay | Admitting: Internal Medicine

## 2011-03-11 ENCOUNTER — Ambulatory Visit (HOSPITAL_COMMUNITY)
Admission: RE | Admit: 2011-03-11 | Discharge: 2011-03-11 | Disposition: A | Discharge status: Home / Self Care | Payer: Medicare Other | Source: Ambulatory Visit | Attending: Internal Medicine | Admitting: Internal Medicine

## 2011-03-11 ENCOUNTER — Encounter (HOSPITAL_BASED_OUTPATIENT_CLINIC_OR_DEPARTMENT_OTHER): Payer: Medicare Other | Admitting: Internal Medicine

## 2011-03-11 DIAGNOSIS — D126 Benign neoplasm of colon, unspecified: Secondary | ICD-10-CM | POA: Insufficient documentation

## 2011-03-11 DIAGNOSIS — I10 Essential (primary) hypertension: Secondary | ICD-10-CM | POA: Insufficient documentation

## 2011-03-11 DIAGNOSIS — Z794 Long term (current) use of insulin: Secondary | ICD-10-CM | POA: Insufficient documentation

## 2011-03-11 DIAGNOSIS — R933 Abnormal findings on diagnostic imaging of other parts of digestive tract: Secondary | ICD-10-CM | POA: Insufficient documentation

## 2011-03-11 DIAGNOSIS — E785 Hyperlipidemia, unspecified: Secondary | ICD-10-CM | POA: Insufficient documentation

## 2011-03-11 DIAGNOSIS — K5732 Diverticulitis of large intestine without perforation or abscess without bleeding: Secondary | ICD-10-CM

## 2011-03-11 DIAGNOSIS — E119 Type 2 diabetes mellitus without complications: Secondary | ICD-10-CM | POA: Insufficient documentation

## 2011-03-11 DIAGNOSIS — Z79899 Other long term (current) drug therapy: Secondary | ICD-10-CM | POA: Insufficient documentation

## 2011-03-11 DIAGNOSIS — K5730 Diverticulosis of large intestine without perforation or abscess without bleeding: Secondary | ICD-10-CM | POA: Insufficient documentation

## 2011-03-11 LAB — GLUCOSE, CAPILLARY: Glucose-Capillary: 173 mg/dL — ABNORMAL HIGH (ref 70–99)

## 2011-04-05 NOTE — Op Note (Signed)
  NAMEHARSHAN, [name] [name]              ACCOUNT NO.:  [id number]  MEDICAL RECORD NO.:  [id number]  LOCATION:  [facility]                          FACILITY:  [facility]  PHYSICIAN:  Lionel December, M.D.    DATE OF BIRTH:  01/11/1966  DATE OF PROCEDURE:  03/11/2011 DATE OF DISCHARGE:                              OPERATIVE REPORT   PROCEDURE:  Colonoscopy.  INDICATION:  [name] is a 45 year old African American male who had abdominopelvic CT recently and found to have filling defect or soft tissue density in the ascending colon.  He does not give history of diarrhea, constipation, or rectal bleeding.  He is undergoing diagnostic colonoscopy.  Procedure risks were reviewed with the patient and informed consent was obtained.  MEDICATIONS FOR CONSCIOUS SEDATION:  Demerol 50 mg IV and Versed 6 mg IV.  FINDINGS:  Procedure performed in endoscopy suite.  The patient's vital signs and O2 sat were monitored during the procedure and remained stable.  The patient was placed in left lateral recumbent position. Rectal examination was performed.  No abnormality noted on external or digital exam.  Pentax videoscope was placed in rectum and advanced under vision into sigmoid colon and beyond.  Preparation was satisfactory. Scope was passed into cecum which was identified of a normal-appearing ileocecal valve.  Blunt and the cecum was well seen and was normal. Picture of appendiceal orifice and ileocecal valve was taken for the record.  As the scope was withdrawn, colonic mucosa was carefully examined.  There was a single small diverticulum at ascending colon. There were two tiny polyps at distal sigmoid colon which were ablated via cold biopsy and submitted in one container.  Rectal mucosa was normal.  Scope was retroflexed to examine anorectal junction which was unremarkable.  Endoscope was withdrawn.  Withdrawal time was 10 minutes. The patient tolerated the procedure well.  FINAL DIAGNOSIS: 1. No evidence  of ascending colon polyp or mass. 2. Single diverticulum at ascending colon. 3. Two small polyps ablated via cold biopsy from distal sigmoid colon.  RECOMMENDATIONS:  Standard instructions given.  I will be contacting the patient with results of biopsy and further recommendations.          ______________________________ Lionel December, M.D.     NR/MEDQ  D:  03/11/2011  T:  03/12/2011  Job:  [id number]  cc:   Lia Hopping, MD Fax: [phone]  Electronically Signed by Lionel December M.D. on 04/05/2011 12:18:16 AM

## 2011-10-12 DIAGNOSIS — E782 Mixed hyperlipidemia: Secondary | ICD-10-CM | POA: Diagnosis not present

## 2011-10-12 DIAGNOSIS — G609 Hereditary and idiopathic neuropathy, unspecified: Secondary | ICD-10-CM | POA: Diagnosis not present

## 2011-10-12 DIAGNOSIS — J019 Acute sinusitis, unspecified: Secondary | ICD-10-CM | POA: Diagnosis not present

## 2011-10-12 DIAGNOSIS — K861 Other chronic pancreatitis: Secondary | ICD-10-CM | POA: Diagnosis not present

## 2011-10-15 DIAGNOSIS — G894 Chronic pain syndrome: Secondary | ICD-10-CM | POA: Diagnosis not present

## 2011-10-15 DIAGNOSIS — G8928 Other chronic postprocedural pain: Secondary | ICD-10-CM | POA: Diagnosis not present

## 2011-10-15 DIAGNOSIS — G90529 Complex regional pain syndrome I of unspecified lower limb: Secondary | ICD-10-CM | POA: Diagnosis not present

## 2011-10-15 DIAGNOSIS — Z79899 Other long term (current) drug therapy: Secondary | ICD-10-CM | POA: Diagnosis not present

## 2011-10-15 DIAGNOSIS — M542 Cervicalgia: Secondary | ICD-10-CM | POA: Diagnosis not present

## 2011-10-15 DIAGNOSIS — F329 Major depressive disorder, single episode, unspecified: Secondary | ICD-10-CM | POA: Diagnosis not present

## 2011-12-07 ENCOUNTER — Emergency Department (HOSPITAL_COMMUNITY): Payer: Medicare Other

## 2011-12-07 ENCOUNTER — Encounter (HOSPITAL_COMMUNITY): Payer: Self-pay

## 2011-12-07 ENCOUNTER — Emergency Department (HOSPITAL_COMMUNITY)
Admission: EM | Admit: 2011-12-07 | Discharge: 2011-12-07 | Disposition: A | Discharge status: Home / Self Care | Payer: Medicare Other | Attending: Emergency Medicine | Admitting: Emergency Medicine

## 2011-12-07 DIAGNOSIS — R05 Cough: Secondary | ICD-10-CM | POA: Insufficient documentation

## 2011-12-07 DIAGNOSIS — R5383 Other fatigue: Secondary | ICD-10-CM | POA: Diagnosis not present

## 2011-12-07 DIAGNOSIS — J069 Acute upper respiratory infection, unspecified: Secondary | ICD-10-CM | POA: Diagnosis not present

## 2011-12-07 DIAGNOSIS — R059 Cough, unspecified: Secondary | ICD-10-CM | POA: Insufficient documentation

## 2011-12-07 DIAGNOSIS — R413 Other amnesia: Secondary | ICD-10-CM | POA: Insufficient documentation

## 2011-12-07 DIAGNOSIS — R7309 Other abnormal glucose: Secondary | ICD-10-CM | POA: Diagnosis not present

## 2011-12-07 DIAGNOSIS — Z9889 Other specified postprocedural states: Secondary | ICD-10-CM | POA: Insufficient documentation

## 2011-12-07 DIAGNOSIS — R5381 Other malaise: Secondary | ICD-10-CM | POA: Diagnosis not present

## 2011-12-07 DIAGNOSIS — R062 Wheezing: Secondary | ICD-10-CM | POA: Insufficient documentation

## 2011-12-07 DIAGNOSIS — F17200 Nicotine dependence, unspecified, uncomplicated: Secondary | ICD-10-CM | POA: Insufficient documentation

## 2011-12-07 DIAGNOSIS — R29898 Other symptoms and signs involving the musculoskeletal system: Secondary | ICD-10-CM | POA: Diagnosis not present

## 2011-12-07 DIAGNOSIS — R259 Unspecified abnormal involuntary movements: Secondary | ICD-10-CM | POA: Insufficient documentation

## 2011-12-07 DIAGNOSIS — K861 Other chronic pancreatitis: Secondary | ICD-10-CM | POA: Insufficient documentation

## 2011-12-07 DIAGNOSIS — R531 Weakness: Secondary | ICD-10-CM

## 2011-12-07 DIAGNOSIS — E119 Type 2 diabetes mellitus without complications: Secondary | ICD-10-CM | POA: Diagnosis not present

## 2011-12-07 HISTORY — DX: Other chronic pancreatitis: K86.1

## 2011-12-07 LAB — URINALYSIS, ROUTINE W REFLEX MICROSCOPIC
Glucose, UA: NEGATIVE mg/dL
Hgb urine dipstick: NEGATIVE
Ketones, ur: NEGATIVE mg/dL
Leukocytes, UA: NEGATIVE
Protein, ur: NEGATIVE mg/dL
pH: 6 (ref 5.0–8.0)

## 2011-12-07 LAB — COMPREHENSIVE METABOLIC PANEL
ALT: 9 U/L (ref 0–53)
AST: 16 U/L (ref 0–37)
Albumin: 2.8 g/dL — ABNORMAL LOW (ref 3.5–5.2)
Alkaline Phosphatase: 107 U/L (ref 39–117)
Chloride: 114 mEq/L — ABNORMAL HIGH (ref 96–112)
Potassium: 3.5 mEq/L (ref 3.5–5.1)
Sodium: 144 mEq/L (ref 135–145)
Total Bilirubin: 0.1 mg/dL — ABNORMAL LOW (ref 0.3–1.2)
Total Protein: 6.7 g/dL (ref 6.0–8.3)

## 2011-12-07 LAB — DIFFERENTIAL
Basophils Absolute: 0 10*3/uL (ref 0.0–0.1)
Basophils Relative: 0 % (ref 0–1)
Eosinophils Absolute: 0.3 10*3/uL (ref 0.0–0.7)
Monocytes Relative: 7 % (ref 3–12)
Neutro Abs: 5.7 10*3/uL (ref 1.7–7.7)
Neutrophils Relative %: 67 % (ref 43–77)

## 2011-12-07 LAB — CBC
Hemoglobin: 11.9 g/dL — ABNORMAL LOW (ref 13.0–17.0)
MCH: 26.9 pg (ref 26.0–34.0)
MCHC: 33.3 g/dL (ref 30.0–36.0)
Platelets: 200 10*3/uL (ref 150–400)
RDW: 16 % — ABNORMAL HIGH (ref 11.5–15.5)

## 2011-12-07 LAB — GLUCOSE, CAPILLARY: Glucose-Capillary: 91 mg/dL (ref 70–99)

## 2011-12-07 MED ORDER — SODIUM CHLORIDE 0.9 % IV SOLN
Freq: Once | INTRAVENOUS | Status: AC
  Administered 2011-12-07: 20:00:00 via INTRAVENOUS

## 2011-12-07 MED ORDER — OXYCODONE HCL 5 MG PO TABS
15.0000 mg | ORAL_TABLET | ORAL | Status: DC | PRN
  Administered 2011-12-07: 15 mg via ORAL
  Filled 2011-12-07: qty 3

## 2011-12-07 NOTE — Discharge Instructions (Signed)
Follow up with your md next week for recheck °

## 2011-12-07 NOTE — ED Notes (Signed)
Pt states he has been shaky, high blood sugars, right leg weakness.

## 2011-12-07 NOTE — ED Notes (Addendum)
Pt vague with complaints.  Repots that he has had elevated BS.  CBG checked. Pt also reporting feeling "weak and shaky". Reporting some right leg weakness.  Pt alert and oriented.  Leg strength equal bilaterally. Denies numbness or tingling at present time.  Denies nausea or vomiting.

## 2011-12-07 NOTE — ED Notes (Signed)
Pt reporting abdominal pain due to chronic pancreatitis. States that he hasn't had his normal doses of oxycontin that he takes 6 times per day.  Discussed with Dr. Estell Harpin.  Verbal order received.

## 2011-12-07 NOTE — ED Provider Notes (Signed)
History  This chart was scribed for [name] Lennert, MD by Bennett Scrape. This patient was seen in room APA02/[facility] and the patient's care was started at 7:34PM.  CSN: [id number]  Arrival date & time 12/07/11  1913   First MD Initiated Contact with Patient 12/07/11 1930      Chief Complaint  Patient presents with  . Hyperglycemia    Patient is a 46 y.o. male presenting with extremity weakness. The history is provided by the patient and the spouse.  Extremity Weakness This is a new problem. The current episode started more than 2 days ago. The problem occurs constantly. The problem has not changed since onset.Pertinent negatives include no chest pain, no abdominal pain, no headaches and no shortness of breath. The symptoms are aggravated by nothing. The symptoms are relieved by nothing. He has tried nothing for the symptoms.    [name] [name] is a 46 y.o. male with a h/o diabetes who presents to the Emergency Department complaining of 4 days of gradual onset, gradually worsening, intermittent right leg weakness and hyperglycemia. Pt describes the weakness as being his leg buckling when he walks. He denies any modifying factors. He claims that he has been complaint with taking his blood sugar medications. He reports that he had previous episodes of similar symptoms but doesn't remember the cause.  He also c/o 4 days of fever, 4 days of gradual onset, gradually worsening, constant non-productive cough and fever. Pt states that he doesn't remember the actual temperature currently. Fever was measured at 98 in the ED. Wife denies any modifying factors and denies that the pt has taken any medications PTA to improve symptoms.  Per wife, at baseline the pt has chronic tremors and memory loss. He is a current everyday smoker but denies alcohol use.  Pt's PCP is Dr. Olena Leatherwood.  Past Medical History  Diagnosis Date  . Diabetes mellitus   . Pancreatitis chronic     Past Surgical History  Procedure  Date  . Colon surgery   . Cholecystectomy     No family history on file.  History  Substance Use Topics  . Smoking status: Current Everyday Smoker -- 1.0 packs/day  . Smokeless tobacco: Not on file  . Alcohol Use: No      Review of Systems  Constitutional: Negative for fatigue.  HENT: Negative for congestion, sinus pressure and ear discharge.   Eyes: Negative for discharge.  Respiratory: Negative for cough and shortness of breath.   Cardiovascular: Negative for chest pain.  Gastrointestinal: Negative for abdominal pain and diarrhea.  Genitourinary: Negative for frequency and hematuria.  Musculoskeletal: Positive for extremity weakness. Negative for back pain.  Skin: Negative for rash.  Neurological: Positive for weakness (Right leg). Negative for seizures and headaches.  Hematological: Negative.   Psychiatric/Behavioral: Negative for hallucinations.    Allergies  Review of patient's allergies indicates no known allergies.  Home Medications  No current outpatient prescriptions on file.  Triage Vitals: Ht 5\' 7"  (1.702 m)  Wt 147 lb (66.679 kg)  BMI 23.02 kg/m2  Physical Exam  Nursing note and vitals reviewed. Constitutional: He is oriented to person, place, and time. He appears well-developed and well-nourished.  HENT:  Head: Normocephalic and atraumatic.       Dry mucous membranes  Eyes: Conjunctivae and EOM are normal. No scleral icterus.  Neck: Neck supple. No thyromegaly present.  Cardiovascular: Normal rate and regular rhythm.  Exam reveals no gallop and no friction rub.   No murmur  heard. Pulmonary/Chest: Effort normal. No stridor. He has wheezes (Minor wheezes in both lung fields). He has no rales. He exhibits no tenderness.  Abdominal: He exhibits no distension. There is no tenderness. There is no rebound.  Musculoskeletal: Normal range of motion. He exhibits no edema.  Lymphadenopathy:    He has no cervical adenopathy.  Neurological: He is oriented to  person, place, and time. Coordination normal.  Skin: No rash noted. No erythema.  Psychiatric: He has a normal mood and affect. His behavior is normal.    ED Course  Procedures (including critical care time)  DIAGNOSTIC STUDIES: Oxygen Saturation is 98% on room air, normal by my interpretation.    COORDINATION OF CARE: 7:40PM-Discussed treatment plan with pt and pt agreed to plan. 10:49PM-All labs and x-rays reviewed with patient and discharge plan discussed. Advised pt to follow up with PCP in a week about the symptoms. Pt given copies of all test results to give to PCP.   Labs Reviewed  CBC - Abnormal; Notable for the following:    Hemoglobin 11.9 (*)    HCT 35.7 (*)    RDW 16.0 (*)    All other components within normal limits  COMPREHENSIVE METABOLIC PANEL - Abnormal; Notable for the following:    Chloride 114 (*)    CO2 18 (*)    Creatinine, Ser 1.39 (*)    Albumin 2.8 (*)    Total Bilirubin 0.1 (*)    GFR calc non Af Amer 59 (*)    GFR calc Af Amer 68 (*)    All other components within normal limits  GLUCOSE, CAPILLARY  DIFFERENTIAL  URINALYSIS, ROUTINE W REFLEX MICROSCOPIC  URINE MICROSCOPIC-ADD ON   Dg Chest 2 View  12/07/2011  *RADIOLOGY REPORT*  Clinical Data: Weakness.  CHEST - 2 VIEW  Comparison: Chest x-ray 12/01/2008.  Findings: Lung volumes are normal.  No consolidative airspace disease.  No pleural effusions.  No pneumothorax.  No pulmonary nodule or mass noted.  Pulmonary vasculature and the cardiomediastinal silhouette are within normal limits.   Surgical clips projecting over the right upper quadrant of the abdomen, likely from prior cholecystectomy.  Numerous vascular calcifications projecting over the left upper quadrant, presumably a splenic artery calcifications (unchanged).  IMPRESSION: 1. No radiographic evidence of acute cardiopulmonary disease.  Original Report Authenticated By: Florencia Reasons, M.D.     No diagnosis found.    MDM  Glenford Peers,   Viral,  Right leg giving out.  Not seen on exam,  Possible knee weakness   The chart was scribed for me under my direct supervision.  I personally performed the history, physical, and medical decision making and all procedures in the evaluation of this patient.[name] Lennert, MD 12/07/11 2255

## 2011-12-09 DIAGNOSIS — F329 Major depressive disorder, single episode, unspecified: Secondary | ICD-10-CM | POA: Diagnosis not present

## 2011-12-09 DIAGNOSIS — G894 Chronic pain syndrome: Secondary | ICD-10-CM | POA: Diagnosis not present

## 2011-12-09 DIAGNOSIS — G90529 Complex regional pain syndrome I of unspecified lower limb: Secondary | ICD-10-CM | POA: Diagnosis not present

## 2011-12-09 DIAGNOSIS — G8928 Other chronic postprocedural pain: Secondary | ICD-10-CM | POA: Diagnosis not present

## 2011-12-13 DIAGNOSIS — J209 Acute bronchitis, unspecified: Secondary | ICD-10-CM | POA: Diagnosis not present

## 2011-12-13 DIAGNOSIS — G609 Hereditary and idiopathic neuropathy, unspecified: Secondary | ICD-10-CM | POA: Diagnosis not present

## 2011-12-13 DIAGNOSIS — K861 Other chronic pancreatitis: Secondary | ICD-10-CM | POA: Diagnosis not present

## 2011-12-13 DIAGNOSIS — E782 Mixed hyperlipidemia: Secondary | ICD-10-CM | POA: Diagnosis not present

## 2011-12-13 DIAGNOSIS — J019 Acute sinusitis, unspecified: Secondary | ICD-10-CM | POA: Diagnosis not present

## 2012-01-13 ENCOUNTER — Other Ambulatory Visit (HOSPITAL_COMMUNITY): Payer: Self-pay | Admitting: "Endocrinology

## 2012-01-13 ENCOUNTER — Ambulatory Visit (HOSPITAL_COMMUNITY)
Admission: RE | Admit: 2012-01-13 | Discharge: 2012-01-13 | Disposition: A | Discharge status: Home / Self Care | Payer: Medicare HMO | Source: Ambulatory Visit | Attending: "Endocrinology | Admitting: "Endocrinology

## 2012-01-13 DIAGNOSIS — R05 Cough: Secondary | ICD-10-CM | POA: Insufficient documentation

## 2012-01-13 DIAGNOSIS — J40 Bronchitis, not specified as acute or chronic: Secondary | ICD-10-CM

## 2012-01-13 DIAGNOSIS — R059 Cough, unspecified: Secondary | ICD-10-CM | POA: Insufficient documentation

## 2012-01-13 DIAGNOSIS — R0989 Other specified symptoms and signs involving the circulatory and respiratory systems: Secondary | ICD-10-CM | POA: Insufficient documentation

## 2012-01-21 DIAGNOSIS — F329 Major depressive disorder, single episode, unspecified: Secondary | ICD-10-CM | POA: Diagnosis not present

## 2012-01-21 DIAGNOSIS — G8928 Other chronic postprocedural pain: Secondary | ICD-10-CM | POA: Diagnosis not present

## 2012-01-21 DIAGNOSIS — G541 Lumbosacral plexus disorders: Secondary | ICD-10-CM | POA: Diagnosis not present

## 2012-01-21 DIAGNOSIS — G90529 Complex regional pain syndrome I of unspecified lower limb: Secondary | ICD-10-CM | POA: Diagnosis not present

## 2012-01-21 DIAGNOSIS — G894 Chronic pain syndrome: Secondary | ICD-10-CM | POA: Diagnosis not present

## 2012-01-21 DIAGNOSIS — Z79899 Other long term (current) drug therapy: Secondary | ICD-10-CM | POA: Diagnosis not present

## 2012-02-18 DIAGNOSIS — F329 Major depressive disorder, single episode, unspecified: Secondary | ICD-10-CM | POA: Diagnosis not present

## 2012-02-18 DIAGNOSIS — G894 Chronic pain syndrome: Secondary | ICD-10-CM | POA: Diagnosis not present

## 2012-02-18 DIAGNOSIS — G90529 Complex regional pain syndrome I of unspecified lower limb: Secondary | ICD-10-CM | POA: Diagnosis not present

## 2012-02-18 DIAGNOSIS — G8928 Other chronic postprocedural pain: Secondary | ICD-10-CM | POA: Diagnosis not present

## 2012-03-17 DIAGNOSIS — G90529 Complex regional pain syndrome I of unspecified lower limb: Secondary | ICD-10-CM | POA: Diagnosis not present

## 2012-03-17 DIAGNOSIS — G8928 Other chronic postprocedural pain: Secondary | ICD-10-CM | POA: Diagnosis not present

## 2012-03-17 DIAGNOSIS — G894 Chronic pain syndrome: Secondary | ICD-10-CM | POA: Diagnosis not present

## 2012-03-17 DIAGNOSIS — F329 Major depressive disorder, single episode, unspecified: Secondary | ICD-10-CM | POA: Diagnosis not present

## 2012-03-17 DIAGNOSIS — H81399 Other peripheral vertigo, unspecified ear: Secondary | ICD-10-CM | POA: Diagnosis not present

## 2012-11-24 DIAGNOSIS — G90529 Complex regional pain syndrome I of unspecified lower limb: Secondary | ICD-10-CM | POA: Diagnosis not present

## 2012-11-24 DIAGNOSIS — G894 Chronic pain syndrome: Secondary | ICD-10-CM | POA: Diagnosis not present

## 2012-11-24 DIAGNOSIS — F329 Major depressive disorder, single episode, unspecified: Secondary | ICD-10-CM | POA: Diagnosis not present

## 2012-11-24 DIAGNOSIS — G8928 Other chronic postprocedural pain: Secondary | ICD-10-CM | POA: Diagnosis not present

## 2012-12-12 DIAGNOSIS — E1065 Type 1 diabetes mellitus with hyperglycemia: Secondary | ICD-10-CM | POA: Diagnosis not present

## 2012-12-12 DIAGNOSIS — Z9119 Patient's noncompliance with other medical treatment and regimen: Secondary | ICD-10-CM | POA: Diagnosis not present

## 2012-12-13 DIAGNOSIS — E1065 Type 1 diabetes mellitus with hyperglycemia: Secondary | ICD-10-CM | POA: Diagnosis not present

## 2012-12-13 DIAGNOSIS — Z9119 Patient's noncompliance with other medical treatment and regimen: Secondary | ICD-10-CM | POA: Diagnosis not present

## 2012-12-13 DIAGNOSIS — K8689 Other specified diseases of pancreas: Secondary | ICD-10-CM | POA: Diagnosis not present

## 2012-12-13 DIAGNOSIS — E785 Hyperlipidemia, unspecified: Secondary | ICD-10-CM | POA: Diagnosis not present

## 2012-12-22 DIAGNOSIS — F329 Major depressive disorder, single episode, unspecified: Secondary | ICD-10-CM | POA: Diagnosis not present

## 2012-12-22 DIAGNOSIS — G90529 Complex regional pain syndrome I of unspecified lower limb: Secondary | ICD-10-CM | POA: Diagnosis not present

## 2012-12-22 DIAGNOSIS — M542 Cervicalgia: Secondary | ICD-10-CM | POA: Diagnosis not present

## 2012-12-22 DIAGNOSIS — G8928 Other chronic postprocedural pain: Secondary | ICD-10-CM | POA: Diagnosis not present

## 2012-12-22 DIAGNOSIS — G894 Chronic pain syndrome: Secondary | ICD-10-CM | POA: Diagnosis not present

## 2012-12-22 DIAGNOSIS — Z79899 Other long term (current) drug therapy: Secondary | ICD-10-CM | POA: Diagnosis not present

## 2013-01-02 DIAGNOSIS — E119 Type 2 diabetes mellitus without complications: Secondary | ICD-10-CM | POA: Diagnosis not present

## 2013-01-02 DIAGNOSIS — N390 Urinary tract infection, site not specified: Secondary | ICD-10-CM | POA: Diagnosis not present

## 2013-01-16 DIAGNOSIS — G90529 Complex regional pain syndrome I of unspecified lower limb: Secondary | ICD-10-CM | POA: Diagnosis not present

## 2013-01-16 DIAGNOSIS — G894 Chronic pain syndrome: Secondary | ICD-10-CM | POA: Diagnosis not present

## 2013-01-16 DIAGNOSIS — F329 Major depressive disorder, single episode, unspecified: Secondary | ICD-10-CM | POA: Diagnosis not present

## 2013-01-16 DIAGNOSIS — G8928 Other chronic postprocedural pain: Secondary | ICD-10-CM | POA: Diagnosis not present

## 2013-01-17 DIAGNOSIS — E1065 Type 1 diabetes mellitus with hyperglycemia: Secondary | ICD-10-CM | POA: Diagnosis not present

## 2013-01-17 DIAGNOSIS — Z9119 Patient's noncompliance with other medical treatment and regimen: Secondary | ICD-10-CM | POA: Diagnosis not present

## 2013-02-01 ENCOUNTER — Ambulatory Visit (INDEPENDENT_AMBULATORY_CARE_PROVIDER_SITE_OTHER): Payer: Medicare Other | Admitting: Otolaryngology

## 2013-02-01 DIAGNOSIS — R1312 Dysphagia, oropharyngeal phase: Secondary | ICD-10-CM

## 2013-02-01 DIAGNOSIS — R490 Dysphonia: Secondary | ICD-10-CM | POA: Diagnosis not present

## 2013-02-01 DIAGNOSIS — K219 Gastro-esophageal reflux disease without esophagitis: Secondary | ICD-10-CM | POA: Diagnosis not present

## 2013-02-13 DIAGNOSIS — E119 Type 2 diabetes mellitus without complications: Secondary | ICD-10-CM | POA: Diagnosis not present

## 2013-02-13 DIAGNOSIS — I10 Essential (primary) hypertension: Secondary | ICD-10-CM | POA: Diagnosis not present

## 2013-02-13 DIAGNOSIS — J449 Chronic obstructive pulmonary disease, unspecified: Secondary | ICD-10-CM | POA: Diagnosis not present

## 2013-02-15 DIAGNOSIS — F329 Major depressive disorder, single episode, unspecified: Secondary | ICD-10-CM | POA: Diagnosis not present

## 2013-02-15 DIAGNOSIS — G894 Chronic pain syndrome: Secondary | ICD-10-CM | POA: Diagnosis not present

## 2013-02-15 DIAGNOSIS — G8928 Other chronic postprocedural pain: Secondary | ICD-10-CM | POA: Diagnosis not present

## 2013-02-15 DIAGNOSIS — G90529 Complex regional pain syndrome I of unspecified lower limb: Secondary | ICD-10-CM | POA: Diagnosis not present

## 2013-02-21 DIAGNOSIS — E785 Hyperlipidemia, unspecified: Secondary | ICD-10-CM | POA: Diagnosis not present

## 2013-02-21 DIAGNOSIS — Z9119 Patient's noncompliance with other medical treatment and regimen: Secondary | ICD-10-CM | POA: Diagnosis not present

## 2013-02-21 DIAGNOSIS — E1065 Type 1 diabetes mellitus with hyperglycemia: Secondary | ICD-10-CM | POA: Diagnosis not present

## 2013-02-28 DIAGNOSIS — I10 Essential (primary) hypertension: Secondary | ICD-10-CM | POA: Diagnosis not present

## 2013-02-28 DIAGNOSIS — K8689 Other specified diseases of pancreas: Secondary | ICD-10-CM | POA: Diagnosis not present

## 2013-02-28 DIAGNOSIS — E785 Hyperlipidemia, unspecified: Secondary | ICD-10-CM | POA: Diagnosis not present

## 2013-02-28 DIAGNOSIS — E1065 Type 1 diabetes mellitus with hyperglycemia: Secondary | ICD-10-CM | POA: Diagnosis not present

## 2013-02-28 DIAGNOSIS — Z9119 Patient's noncompliance with other medical treatment and regimen: Secondary | ICD-10-CM | POA: Diagnosis not present

## 2013-03-02 DIAGNOSIS — R7301 Impaired fasting glucose: Secondary | ICD-10-CM | POA: Diagnosis not present

## 2013-03-02 DIAGNOSIS — R7309 Other abnormal glucose: Secondary | ICD-10-CM | POA: Diagnosis not present

## 2013-03-15 ENCOUNTER — Ambulatory Visit (INDEPENDENT_AMBULATORY_CARE_PROVIDER_SITE_OTHER): Payer: Medicare Other | Admitting: Otolaryngology

## 2013-03-15 DIAGNOSIS — F329 Major depressive disorder, single episode, unspecified: Secondary | ICD-10-CM | POA: Diagnosis not present

## 2013-03-15 DIAGNOSIS — M545 Low back pain: Secondary | ICD-10-CM | POA: Diagnosis not present

## 2013-03-15 DIAGNOSIS — G8928 Other chronic postprocedural pain: Secondary | ICD-10-CM | POA: Diagnosis not present

## 2013-03-15 DIAGNOSIS — Z79899 Other long term (current) drug therapy: Secondary | ICD-10-CM | POA: Diagnosis not present

## 2013-03-15 DIAGNOSIS — M546 Pain in thoracic spine: Secondary | ICD-10-CM | POA: Diagnosis not present

## 2013-03-15 DIAGNOSIS — G894 Chronic pain syndrome: Secondary | ICD-10-CM | POA: Diagnosis not present

## 2013-03-15 DIAGNOSIS — G90529 Complex regional pain syndrome I of unspecified lower limb: Secondary | ICD-10-CM | POA: Diagnosis not present

## 2013-04-12 DIAGNOSIS — Z79899 Other long term (current) drug therapy: Secondary | ICD-10-CM | POA: Diagnosis not present

## 2013-04-12 DIAGNOSIS — G894 Chronic pain syndrome: Secondary | ICD-10-CM | POA: Diagnosis not present

## 2013-04-12 DIAGNOSIS — M545 Low back pain: Secondary | ICD-10-CM | POA: Diagnosis not present

## 2013-04-12 DIAGNOSIS — G90529 Complex regional pain syndrome I of unspecified lower limb: Secondary | ICD-10-CM | POA: Diagnosis not present

## 2013-04-12 DIAGNOSIS — G8928 Other chronic postprocedural pain: Secondary | ICD-10-CM | POA: Diagnosis not present

## 2013-04-12 DIAGNOSIS — F329 Major depressive disorder, single episode, unspecified: Secondary | ICD-10-CM | POA: Diagnosis not present

## 2013-05-15 ENCOUNTER — Other Ambulatory Visit (HOSPITAL_COMMUNITY): Payer: Self-pay | Admitting: Internal Medicine

## 2013-05-15 ENCOUNTER — Ambulatory Visit (HOSPITAL_COMMUNITY)
Admission: RE | Admit: 2013-05-15 | Discharge: 2013-05-15 | Disposition: A | Discharge status: Home / Self Care | Payer: Medicare Other | Source: Ambulatory Visit | Attending: Internal Medicine | Admitting: Internal Medicine

## 2013-05-15 DIAGNOSIS — K861 Other chronic pancreatitis: Secondary | ICD-10-CM | POA: Diagnosis not present

## 2013-05-15 DIAGNOSIS — I10 Essential (primary) hypertension: Secondary | ICD-10-CM | POA: Diagnosis not present

## 2013-05-15 DIAGNOSIS — J40 Bronchitis, not specified as acute or chronic: Secondary | ICD-10-CM | POA: Diagnosis not present

## 2013-05-15 DIAGNOSIS — J410 Simple chronic bronchitis: Secondary | ICD-10-CM | POA: Diagnosis not present

## 2013-05-15 DIAGNOSIS — J984 Other disorders of lung: Secondary | ICD-10-CM | POA: Diagnosis not present

## 2013-05-15 DIAGNOSIS — E119 Type 2 diabetes mellitus without complications: Secondary | ICD-10-CM | POA: Diagnosis not present

## 2013-05-24 DIAGNOSIS — M546 Pain in thoracic spine: Secondary | ICD-10-CM | POA: Diagnosis not present

## 2013-05-24 DIAGNOSIS — G90529 Complex regional pain syndrome I of unspecified lower limb: Secondary | ICD-10-CM | POA: Diagnosis not present

## 2013-05-24 DIAGNOSIS — M545 Low back pain: Secondary | ICD-10-CM | POA: Diagnosis not present

## 2013-05-24 DIAGNOSIS — F329 Major depressive disorder, single episode, unspecified: Secondary | ICD-10-CM | POA: Diagnosis not present

## 2013-05-24 DIAGNOSIS — G8928 Other chronic postprocedural pain: Secondary | ICD-10-CM | POA: Diagnosis not present

## 2013-05-24 DIAGNOSIS — G894 Chronic pain syndrome: Secondary | ICD-10-CM | POA: Diagnosis not present

## 2013-06-21 DIAGNOSIS — G894 Chronic pain syndrome: Secondary | ICD-10-CM | POA: Diagnosis not present

## 2013-06-21 DIAGNOSIS — G8928 Other chronic postprocedural pain: Secondary | ICD-10-CM | POA: Diagnosis not present

## 2013-06-21 DIAGNOSIS — F329 Major depressive disorder, single episode, unspecified: Secondary | ICD-10-CM | POA: Diagnosis not present

## 2013-06-21 DIAGNOSIS — G90529 Complex regional pain syndrome I of unspecified lower limb: Secondary | ICD-10-CM | POA: Diagnosis not present

## 2013-07-10 DIAGNOSIS — Z23 Encounter for immunization: Secondary | ICD-10-CM | POA: Diagnosis not present

## 2013-07-13 DIAGNOSIS — M545 Low back pain: Secondary | ICD-10-CM | POA: Diagnosis not present

## 2013-07-13 DIAGNOSIS — G894 Chronic pain syndrome: Secondary | ICD-10-CM | POA: Diagnosis not present

## 2013-07-13 DIAGNOSIS — G90529 Complex regional pain syndrome I of unspecified lower limb: Secondary | ICD-10-CM | POA: Diagnosis not present

## 2013-07-13 DIAGNOSIS — G8928 Other chronic postprocedural pain: Secondary | ICD-10-CM | POA: Diagnosis not present

## 2013-07-13 DIAGNOSIS — F329 Major depressive disorder, single episode, unspecified: Secondary | ICD-10-CM | POA: Diagnosis not present

## 2013-07-13 DIAGNOSIS — M546 Pain in thoracic spine: Secondary | ICD-10-CM | POA: Diagnosis not present

## 2013-07-13 DIAGNOSIS — Z79899 Other long term (current) drug therapy: Secondary | ICD-10-CM | POA: Diagnosis not present

## 2013-08-17 DIAGNOSIS — F329 Major depressive disorder, single episode, unspecified: Secondary | ICD-10-CM | POA: Diagnosis not present

## 2013-08-17 DIAGNOSIS — M546 Pain in thoracic spine: Secondary | ICD-10-CM | POA: Diagnosis not present

## 2013-08-17 DIAGNOSIS — Z79899 Other long term (current) drug therapy: Secondary | ICD-10-CM | POA: Diagnosis not present

## 2013-08-17 DIAGNOSIS — G8928 Other chronic postprocedural pain: Secondary | ICD-10-CM | POA: Diagnosis not present

## 2013-08-17 DIAGNOSIS — G90529 Complex regional pain syndrome I of unspecified lower limb: Secondary | ICD-10-CM | POA: Diagnosis not present

## 2013-08-17 DIAGNOSIS — M545 Low back pain: Secondary | ICD-10-CM | POA: Diagnosis not present

## 2013-08-17 DIAGNOSIS — G894 Chronic pain syndrome: Secondary | ICD-10-CM | POA: Diagnosis not present

## 2013-08-28 DIAGNOSIS — I10 Essential (primary) hypertension: Secondary | ICD-10-CM | POA: Diagnosis not present

## 2013-08-28 DIAGNOSIS — E119 Type 2 diabetes mellitus without complications: Secondary | ICD-10-CM | POA: Diagnosis not present

## 2013-09-13 DIAGNOSIS — K8689 Other specified diseases of pancreas: Secondary | ICD-10-CM | POA: Diagnosis not present

## 2013-09-13 DIAGNOSIS — E785 Hyperlipidemia, unspecified: Secondary | ICD-10-CM | POA: Diagnosis not present

## 2013-09-13 DIAGNOSIS — I10 Essential (primary) hypertension: Secondary | ICD-10-CM | POA: Diagnosis not present

## 2013-09-13 DIAGNOSIS — E1065 Type 1 diabetes mellitus with hyperglycemia: Secondary | ICD-10-CM | POA: Diagnosis not present

## 2013-09-14 DIAGNOSIS — G894 Chronic pain syndrome: Secondary | ICD-10-CM | POA: Diagnosis not present

## 2013-09-14 DIAGNOSIS — H814 Vertigo of central origin: Secondary | ICD-10-CM | POA: Diagnosis not present

## 2013-09-14 DIAGNOSIS — H8190 Unspecified disorder of vestibular function, unspecified ear: Secondary | ICD-10-CM | POA: Diagnosis not present

## 2013-09-14 DIAGNOSIS — H81399 Other peripheral vertigo, unspecified ear: Secondary | ICD-10-CM | POA: Diagnosis not present

## 2013-09-14 DIAGNOSIS — H905 Unspecified sensorineural hearing loss: Secondary | ICD-10-CM | POA: Diagnosis not present

## 2013-09-14 DIAGNOSIS — G541 Lumbosacral plexus disorders: Secondary | ICD-10-CM | POA: Diagnosis not present

## 2013-09-14 DIAGNOSIS — G8928 Other chronic postprocedural pain: Secondary | ICD-10-CM | POA: Diagnosis not present

## 2013-09-14 DIAGNOSIS — G90529 Complex regional pain syndrome I of unspecified lower limb: Secondary | ICD-10-CM | POA: Diagnosis not present

## 2013-09-19 DIAGNOSIS — H2589 Other age-related cataract: Secondary | ICD-10-CM | POA: Diagnosis not present

## 2013-09-19 DIAGNOSIS — I10 Essential (primary) hypertension: Secondary | ICD-10-CM | POA: Diagnosis not present

## 2013-09-19 DIAGNOSIS — Z9119 Patient's noncompliance with other medical treatment and regimen: Secondary | ICD-10-CM | POA: Diagnosis not present

## 2013-09-19 DIAGNOSIS — E785 Hyperlipidemia, unspecified: Secondary | ICD-10-CM | POA: Diagnosis not present

## 2013-09-19 DIAGNOSIS — E039 Hypothyroidism, unspecified: Secondary | ICD-10-CM | POA: Diagnosis not present

## 2013-09-19 DIAGNOSIS — E559 Vitamin D deficiency, unspecified: Secondary | ICD-10-CM | POA: Diagnosis not present

## 2013-09-19 DIAGNOSIS — H52229 Regular astigmatism, unspecified eye: Secondary | ICD-10-CM | POA: Diagnosis not present

## 2013-09-19 DIAGNOSIS — H5210 Myopia, unspecified eye: Secondary | ICD-10-CM | POA: Diagnosis not present

## 2013-09-19 DIAGNOSIS — E1065 Type 1 diabetes mellitus with hyperglycemia: Secondary | ICD-10-CM | POA: Diagnosis not present

## 2013-09-25 DIAGNOSIS — K8689 Other specified diseases of pancreas: Secondary | ICD-10-CM | POA: Diagnosis not present

## 2013-09-25 DIAGNOSIS — E1065 Type 1 diabetes mellitus with hyperglycemia: Secondary | ICD-10-CM | POA: Diagnosis not present

## 2013-09-25 DIAGNOSIS — E039 Hypothyroidism, unspecified: Secondary | ICD-10-CM | POA: Diagnosis not present

## 2013-09-25 DIAGNOSIS — E559 Vitamin D deficiency, unspecified: Secondary | ICD-10-CM | POA: Diagnosis not present

## 2013-10-12 DIAGNOSIS — K859 Acute pancreatitis without necrosis or infection, unspecified: Secondary | ICD-10-CM | POA: Diagnosis not present

## 2013-10-12 DIAGNOSIS — M545 Low back pain, unspecified: Secondary | ICD-10-CM | POA: Diagnosis not present

## 2013-10-23 DIAGNOSIS — K8689 Other specified diseases of pancreas: Secondary | ICD-10-CM | POA: Diagnosis not present

## 2013-10-23 DIAGNOSIS — Z9119 Patient's noncompliance with other medical treatment and regimen: Secondary | ICD-10-CM | POA: Diagnosis not present

## 2013-10-23 DIAGNOSIS — E039 Hypothyroidism, unspecified: Secondary | ICD-10-CM | POA: Diagnosis not present

## 2013-10-23 DIAGNOSIS — E1065 Type 1 diabetes mellitus with hyperglycemia: Secondary | ICD-10-CM | POA: Diagnosis not present

## 2013-10-23 DIAGNOSIS — E559 Vitamin D deficiency, unspecified: Secondary | ICD-10-CM | POA: Diagnosis not present

## 2013-10-23 DIAGNOSIS — IMO0002 Reserved for concepts with insufficient information to code with codable children: Secondary | ICD-10-CM | POA: Diagnosis not present

## 2013-10-23 DIAGNOSIS — Z91199 Patient's noncompliance with other medical treatment and regimen due to unspecified reason: Secondary | ICD-10-CM | POA: Diagnosis not present

## 2013-11-09 DIAGNOSIS — M545 Low back pain, unspecified: Secondary | ICD-10-CM | POA: Diagnosis not present

## 2013-11-19 DIAGNOSIS — H25049 Posterior subcapsular polar age-related cataract, unspecified eye: Secondary | ICD-10-CM | POA: Diagnosis not present

## 2013-11-19 DIAGNOSIS — E119 Type 2 diabetes mellitus without complications: Secondary | ICD-10-CM | POA: Diagnosis not present

## 2013-11-21 ENCOUNTER — Encounter (HOSPITAL_COMMUNITY): Admission: RE | Admit: 2013-11-21 | Payer: Medicare Other | Source: Ambulatory Visit

## 2013-11-21 ENCOUNTER — Encounter (HOSPITAL_COMMUNITY): Payer: Self-pay

## 2013-11-21 DIAGNOSIS — Z794 Long term (current) use of insulin: Secondary | ICD-10-CM | POA: Diagnosis not present

## 2013-11-21 DIAGNOSIS — E119 Type 2 diabetes mellitus without complications: Secondary | ICD-10-CM | POA: Diagnosis not present

## 2013-11-21 DIAGNOSIS — F411 Generalized anxiety disorder: Secondary | ICD-10-CM | POA: Diagnosis not present

## 2013-11-21 DIAGNOSIS — I10 Essential (primary) hypertension: Secondary | ICD-10-CM | POA: Diagnosis not present

## 2013-11-21 DIAGNOSIS — F17200 Nicotine dependence, unspecified, uncomplicated: Secondary | ICD-10-CM | POA: Diagnosis not present

## 2013-11-21 DIAGNOSIS — Z79899 Other long term (current) drug therapy: Secondary | ICD-10-CM | POA: Diagnosis not present

## 2013-11-21 DIAGNOSIS — H2589 Other age-related cataract: Secondary | ICD-10-CM | POA: Diagnosis not present

## 2013-11-21 HISTORY — DX: Polyneuropathy, unspecified: G62.9

## 2013-11-21 HISTORY — DX: Essential (primary) hypertension: I10

## 2013-11-21 HISTORY — DX: Pure hypercholesterolemia, unspecified: E78.00

## 2013-11-21 HISTORY — DX: Gastro-esophageal reflux disease without esophagitis: K21.9

## 2013-11-21 HISTORY — DX: Anxiety disorder, unspecified: F41.9

## 2013-11-21 LAB — BASIC METABOLIC PANEL
BUN: 16 mg/dL (ref 6–23)
CHLORIDE: 108 meq/L (ref 96–112)
CO2: 22 mEq/L (ref 19–32)
Calcium: 9.3 mg/dL (ref 8.4–10.5)
Creatinine, Ser: 1.21 mg/dL (ref 0.50–1.35)
GFR, EST AFRICAN AMERICAN: 80 mL/min — AB (ref >90)
GFR, EST NON AFRICAN AMERICAN: 69 mL/min — AB (ref >90)
Glucose, Bld: 150 mg/dL — ABNORMAL HIGH (ref 70–99)
POTASSIUM: 4.4 meq/L (ref 3.7–5.3)
SODIUM: 142 meq/L (ref 137–147)

## 2013-11-21 LAB — HEMOGLOBIN AND HEMATOCRIT, BLOOD
HCT: 38.1 % — ABNORMAL LOW (ref 39.0–52.0)
HEMOGLOBIN: 12.2 g/dL — AB (ref 13.0–17.0)

## 2013-11-21 NOTE — Patient Instructions (Signed)
Your procedure is scheduled on: 11/26/2013  Report to [facility] at 71 AM.  Call this number if you have problems the morning of surgery: [phone]   Do not eat food or drink liquids :After Midnight.      Take these medicines the morning of surgery with A SIP OF WATER:wellbutrin,neurontin, vistaril,reglan, prilosec, oxycodone, altace, zanaflex.. No diabetic medications. Take 1/2 of your lantus dosage the night before your procedure.   Do not wear jewelry, make-up or nail polish.  Do not wear lotions, powders, or perfumes.  Do not shave 48 hours prior to surgery.  Do not bring valuables to the hospital.  Contacts, dentures or bridgework may not be worn into surgery.  Leave suitcase in the car. After surgery it may be brought to your room.  For patients admitted to the hospital, checkout time is 11:00 AM the day of discharge.   Patients discharged the day of surgery will not be allowed to drive home.  :     Please read over the following fact sheets that you were given: Coughing and Deep Breathing, Surgical Site Infection Prevention, Anesthesia Post-op Instructions and Care and Recovery After Surgery    Cataract A cataract is a clouding of the lens of the eye. When a lens becomes cloudy, vision is reduced based on the degree and nature of the clouding. Many cataracts reduce vision to some degree. Some cataracts make people more near-sighted as they develop. Other cataracts increase glare. Cataracts that are ignored and become worse can sometimes look white. The white color can be seen through the pupil. CAUSES   Aging. However, cataracts may occur at any age, even in newborns.   Certain drugs.   Trauma to the eye.   Certain diseases such as diabetes.   Specific eye diseases such as chronic inflammation inside the eye or a sudden attack of a rare form of glaucoma.   Inherited or acquired medical problems.  SYMPTOMS   Gradual, progressive drop in vision in the affected eye.    Severe, rapid visual loss. This most often happens when trauma is the cause.  DIAGNOSIS  To detect a cataract, an eye doctor examines the lens. Cataracts are best diagnosed with an exam of the eyes with the pupils enlarged (dilated) by drops.  TREATMENT  For an early cataract, vision may improve by using different eyeglasses or stronger lighting. If that does not help your vision, surgery is the only effective treatment. A cataract needs to be surgically removed when vision loss interferes with your everyday activities, such as driving, reading, or watching TV. A cataract may also have to be removed if it prevents examination or treatment of another eye problem. Surgery removes the cloudy lens and usually replaces it with a substitute lens (intraocular lens, IOL).  At a time when both you and your doctor agree, the cataract will be surgically removed. If you have cataracts in both eyes, only one is usually removed at a time. This allows the operated eye to heal and be out of danger from any possible problems after surgery (such as infection or poor wound healing). In rare cases, a cataract may be doing damage to your eye. In these cases, your caregiver may advise surgical removal right away. The vast majority of people who have cataract surgery have better vision afterward. HOME CARE INSTRUCTIONS  If you are not planning surgery, you may be asked to do the following:  Use different eyeglasses.   Use stronger or brighter lighting.  Ask your eye doctor about reducing your medicine dose or changing medicines if it is thought that a medicine caused your cataract. Changing medicines does not make the cataract go away on its own.   Become familiar with your surroundings. Poor vision can lead to injury. Avoid bumping into things on the affected side. You are at a higher risk for tripping or falling.   Exercise extreme care when driving or operating machinery.   Wear sunglasses if you are sensitive  to bright light or experiencing problems with glare.  SEEK IMMEDIATE MEDICAL CARE IF:   You have a worsening or sudden vision loss.   You notice redness, swelling, or increasing pain in the eye.   You have a fever.  Document Released: 09/20/2005 Document Revised: 09/09/2011 Document Reviewed: 05/14/2011 [facility] Patient Information 2012 [employer].PATIENT INSTRUCTIONS POST-ANESTHESIA  IMMEDIATELY FOLLOWING SURGERY:  Do not drive or operate machinery for the first twenty four hours after surgery.  Do not make any important decisions for twenty four hours after surgery or while taking narcotic pain medications or sedatives.  If you develop intractable nausea and vomiting or a severe headache please notify your doctor immediately.  FOLLOW-UP:  Please make an appointment with your surgeon as instructed. You do not need to follow up with anesthesia unless specifically instructed to do so.  WOUND CARE INSTRUCTIONS (if applicable):  Keep a dry clean dressing on the anesthesia/puncture wound site if there is drainage.  Once the wound has quit draining you may leave it open to air.  Generally you should leave the bandage intact for twenty four hours unless there is drainage.  If the epidural site drains for more than 36-48 hours please call the anesthesia department.  QUESTIONS?:  Please feel free to call your physician or the hospital operator if you have any questions, and they will be happy to assist you.

## 2013-11-23 MED ORDER — CYCLOPENTOLATE-PHENYLEPHRINE OP SOLN OPTIME - NO CHARGE
OPHTHALMIC | Status: AC
  Filled 2013-11-23: qty 2

## 2013-11-23 MED ORDER — PHENYLEPHRINE HCL 2.5 % OP SOLN
OPHTHALMIC | Status: AC
  Filled 2013-11-23: qty 15

## 2013-11-23 MED ORDER — NEOMYCIN-POLYMYXIN-DEXAMETH 3.5-10000-0.1 OP SUSP
OPHTHALMIC | Status: AC
  Filled 2013-11-23: qty 5

## 2013-11-23 MED ORDER — TETRACAINE HCL 0.5 % OP SOLN
OPHTHALMIC | Status: AC
  Filled 2013-11-23: qty 2

## 2013-11-23 MED ORDER — LIDOCAINE HCL 3.5 % OP GEL
OPHTHALMIC | Status: AC
  Filled 2013-11-23: qty 1

## 2013-11-23 MED ORDER — LIDOCAINE HCL (PF) 1 % IJ SOLN
INTRAMUSCULAR | Status: AC
  Filled 2013-11-23: qty 2

## 2013-11-26 ENCOUNTER — Ambulatory Visit (HOSPITAL_COMMUNITY)
Admission: RE | Admit: 2013-11-26 | Discharge: 2013-11-26 | Disposition: A | Discharge status: Home / Self Care | Payer: Medicare Other | Source: Ambulatory Visit | Attending: Ophthalmology | Admitting: Ophthalmology

## 2013-11-26 ENCOUNTER — Encounter (HOSPITAL_COMMUNITY): Payer: Medicare Other | Admitting: Anesthesiology

## 2013-11-26 ENCOUNTER — Encounter (HOSPITAL_COMMUNITY): Payer: Self-pay | Admitting: *Deleted

## 2013-11-26 ENCOUNTER — Ambulatory Visit (HOSPITAL_COMMUNITY): Payer: Medicare Other | Admitting: Anesthesiology

## 2013-11-26 ENCOUNTER — Encounter (HOSPITAL_COMMUNITY)
Admission: RE | Disposition: A | Discharge status: Home / Self Care | Payer: Self-pay | Source: Ambulatory Visit | Attending: Ophthalmology

## 2013-11-26 DIAGNOSIS — H2589 Other age-related cataract: Secondary | ICD-10-CM | POA: Diagnosis not present

## 2013-11-26 DIAGNOSIS — I10 Essential (primary) hypertension: Secondary | ICD-10-CM | POA: Diagnosis not present

## 2013-11-26 DIAGNOSIS — H25049 Posterior subcapsular polar age-related cataract, unspecified eye: Secondary | ICD-10-CM | POA: Diagnosis not present

## 2013-11-26 DIAGNOSIS — Z79899 Other long term (current) drug therapy: Secondary | ICD-10-CM | POA: Insufficient documentation

## 2013-11-26 DIAGNOSIS — E119 Type 2 diabetes mellitus without complications: Secondary | ICD-10-CM | POA: Insufficient documentation

## 2013-11-26 DIAGNOSIS — F411 Generalized anxiety disorder: Secondary | ICD-10-CM | POA: Insufficient documentation

## 2013-11-26 DIAGNOSIS — Z794 Long term (current) use of insulin: Secondary | ICD-10-CM | POA: Insufficient documentation

## 2013-11-26 DIAGNOSIS — H269 Unspecified cataract: Secondary | ICD-10-CM | POA: Diagnosis not present

## 2013-11-26 DIAGNOSIS — F17200 Nicotine dependence, unspecified, uncomplicated: Secondary | ICD-10-CM | POA: Insufficient documentation

## 2013-11-26 HISTORY — PX: CATARACT EXTRACTION W/PHACO: SHX586

## 2013-11-26 LAB — GLUCOSE, CAPILLARY: Glucose-Capillary: 144 mg/dL — ABNORMAL HIGH (ref 70–99)

## 2013-11-26 SURGERY — PHACOEMULSIFICATION, CATARACT, WITH IOL INSERTION
Anesthesia: Monitor Anesthesia Care | Site: Eye | Laterality: Right

## 2013-11-26 MED ORDER — FENTANYL CITRATE 0.05 MG/ML IJ SOLN
25.0000 ug | INTRAMUSCULAR | Status: AC
  Administered 2013-11-26 (×2): 25 ug via INTRAVENOUS

## 2013-11-26 MED ORDER — PROVISC 10 MG/ML IO SOLN
INTRAOCULAR | Status: DC | PRN
Start: 2013-11-26 — End: 2013-11-26
  Administered 2013-11-26: 0.85 mL via INTRAOCULAR

## 2013-11-26 MED ORDER — POVIDONE-IODINE 5 % OP SOLN
OPHTHALMIC | Status: DC | PRN
  Administered 2013-11-26: 1 via OPHTHALMIC

## 2013-11-26 MED ORDER — EPINEPHRINE HCL 1 MG/ML IJ SOLN
INTRAMUSCULAR | Status: AC
  Filled 2013-11-26: qty 1

## 2013-11-26 MED ORDER — EPINEPHRINE HCL 1 MG/ML IJ SOLN
INTRAOCULAR | Status: DC | PRN
  Administered 2013-11-26: 09:00:00

## 2013-11-26 MED ORDER — LACTATED RINGERS IV SOLN
INTRAVENOUS | Status: DC
  Administered 2013-11-26: 08:00:00 via INTRAVENOUS

## 2013-11-26 MED ORDER — MIDAZOLAM HCL 2 MG/2ML IJ SOLN
1.0000 mg | INTRAMUSCULAR | Status: DC | PRN
  Administered 2013-11-26: 2 mg via INTRAVENOUS

## 2013-11-26 MED ORDER — LIDOCAINE HCL (PF) 1 % IJ SOLN
INTRAMUSCULAR | Status: DC | PRN
  Administered 2013-11-26: .5 mL

## 2013-11-26 MED ORDER — BSS IO SOLN
INTRAOCULAR | Status: DC | PRN
  Administered 2013-11-26: 15 mL via INTRAOCULAR

## 2013-11-26 MED ORDER — LIDOCAINE HCL 3.5 % OP GEL
1.0000 "application " | Freq: Once | OPHTHALMIC | Status: AC
  Administered 2013-11-26: 1 via OPHTHALMIC

## 2013-11-26 MED ORDER — TETRACAINE HCL 0.5 % OP SOLN
1.0000 [drp] | OPHTHALMIC | Status: AC
  Administered 2013-11-26 (×3): 1 [drp] via OPHTHALMIC

## 2013-11-26 MED ORDER — PHENYLEPHRINE HCL 2.5 % OP SOLN
1.0000 [drp] | OPHTHALMIC | Status: AC
  Administered 2013-11-26 (×3): 1 [drp] via OPHTHALMIC

## 2013-11-26 MED ORDER — FENTANYL CITRATE 0.05 MG/ML IJ SOLN
INTRAMUSCULAR | Status: AC
  Filled 2013-11-26: qty 2

## 2013-11-26 MED ORDER — LIDOCAINE 3.5 % OP GEL OPTIME - NO CHARGE
OPHTHALMIC | Status: DC | PRN
  Administered 2013-11-26: 1 [drp] via OPHTHALMIC

## 2013-11-26 MED ORDER — MIDAZOLAM HCL 2 MG/2ML IJ SOLN
INTRAMUSCULAR | Status: AC
  Filled 2013-11-26: qty 2

## 2013-11-26 MED ORDER — NEOMYCIN-POLYMYXIN-DEXAMETH 3.5-10000-0.1 OP SUSP
OPHTHALMIC | Status: DC | PRN
  Administered 2013-11-26: 1 [drp] via OPHTHALMIC

## 2013-11-26 MED ORDER — CYCLOPENTOLATE-PHENYLEPHRINE 0.2-1 % OP SOLN
1.0000 [drp] | OPHTHALMIC | Status: AC
  Administered 2013-11-26 (×3): 1 [drp] via OPHTHALMIC

## 2013-11-26 SURGICAL SUPPLY — 33 items
CAPSULAR TENSION RING-AMO (OPHTHALMIC RELATED) IMPLANT
CLOTH BEACON ORANGE TIMEOUT ST (SAFETY) ×2 IMPLANT
EYE SHIELD UNIVERSAL CLEAR (GAUZE/BANDAGES/DRESSINGS) ×2 IMPLANT
GLOVE BIO SURGEON STRL SZ 6.5 (GLOVE) IMPLANT
GLOVE BIO SURGEONS STRL SZ 6.5 (GLOVE)
GLOVE BIOGEL PI IND STRL 6.5 (GLOVE) IMPLANT
GLOVE BIOGEL PI IND STRL 7.0 (GLOVE) IMPLANT
GLOVE BIOGEL PI IND STRL 7.5 (GLOVE) IMPLANT
GLOVE BIOGEL PI INDICATOR 6.5 (GLOVE)
GLOVE BIOGEL PI INDICATOR 7.0 (GLOVE) ×2
GLOVE BIOGEL PI INDICATOR 7.5 (GLOVE)
GLOVE ECLIPSE 6.5 STRL STRAW (GLOVE) IMPLANT
GLOVE ECLIPSE 7.0 STRL STRAW (GLOVE) IMPLANT
GLOVE ECLIPSE 7.5 STRL STRAW (GLOVE) IMPLANT
GLOVE EXAM NITRILE LRG STRL (GLOVE) IMPLANT
GLOVE EXAM NITRILE MD LF STRL (GLOVE) ×2 IMPLANT
GLOVE SKINSENSE NS SZ6.5 (GLOVE)
GLOVE SKINSENSE NS SZ7.0 (GLOVE)
GLOVE SKINSENSE STRL SZ6.5 (GLOVE) IMPLANT
GLOVE SKINSENSE STRL SZ7.0 (GLOVE) IMPLANT
KIT VITRECTOMY (OPHTHALMIC RELATED) IMPLANT
PAD ARMBOARD 7.5X6 YLW CONV (MISCELLANEOUS) ×2 IMPLANT
PROC W NO LENS (INTRAOCULAR LENS)
PROC W SPEC LENS (INTRAOCULAR LENS)
PROCESS W NO LENS (INTRAOCULAR LENS) IMPLANT
PROCESS W SPEC LENS (INTRAOCULAR LENS) IMPLANT
RING MALYGIN (MISCELLANEOUS) IMPLANT
SIGHTPATH CAT PROC W REG LENS (Ophthalmic Related) ×3 IMPLANT
SYR TB 1ML LL NO SAFETY (SYRINGE) ×2 IMPLANT
TAPE SURG TRANSPORE 1 IN (GAUZE/BANDAGES/DRESSINGS) IMPLANT
TAPE SURGICAL TRANSPORE 1 IN (GAUZE/BANDAGES/DRESSINGS) ×2
VISCOELASTIC ADDITIONAL (OPHTHALMIC RELATED) IMPLANT
WATER STERILE IRR 250ML POUR (IV SOLUTION) ×2 IMPLANT

## 2013-11-26 NOTE — Discharge Instructions (Signed)
Post op appointment today at 11:45am   PATIENT INSTRUCTIONS POST-ANESTHESIA  IMMEDIATELY FOLLOWING SURGERY:  Do not drive or operate machinery for the first twenty four hours after surgery.  Do not make any important decisions for twenty four hours after surgery or while taking narcotic pain medications or sedatives.  If you develop intractable nausea and vomiting or a severe headache please notify your doctor immediately.  FOLLOW-UP:  Please make an appointment with your surgeon as instructed. You do not need to follow up with anesthesia unless specifically instructed to do so.  WOUND CARE INSTRUCTIONS (if applicable):  Keep a dry clean dressing on the anesthesia/puncture wound site if there is drainage.  Once the wound has quit draining you may leave it open to air.  Generally you should leave the bandage intact for twenty four hours unless there is drainage.  If the epidural site drains for more than 36-48 hours please call the anesthesia department.  QUESTIONS?:  Please feel free to call your physician or the hospital operator if you have any questions, and they will be happy to assist you.

## 2013-11-26 NOTE — H&P (Signed)
I have reviewed the H&P, the patient was re-examined, and I have identified no interval changes in medical condition and plan of care since the history and physical of record  

## 2013-11-26 NOTE — Transfer of Care (Signed)
Immediate Anesthesia Transfer of Care Note  Patient: [name] [name]  Procedure(s) Performed: Procedure(s) with comments: CATARACT EXTRACTION PHACO AND INTRAOCULAR LENS PLACEMENT (IOC) (Right) - CDE:14.53  Patient Location: Short Stay  Anesthesia Type:MAC  Level of Consciousness: awake  Airway & Oxygen Therapy: Patient Spontanous Breathing  Post-op Assessment: Report given to PACU RN  Post vital signs: Reviewed  Complications: No apparent anesthesia complications

## 2013-11-26 NOTE — Op Note (Signed)
Date of Admission: 11/26/2013  Date of Surgery: 11/26/2013  Pre-Op Dx: Cataract  Right  Eye  Post-Op Dx: Combined Cataract  Right  Eye,  Dx Code 366.19  Surgeon: Tonny Branch, M.D.  Assistants: None  Anesthesia: Topical with MAC  Indications: Painless, progressive loss of vision with compromise of daily activities.  Surgery: Cataract Extraction with Intraocular lens Implant Right Eye  Discription: The patient had dilating drops and viscous lidocaine placed into the right eye in the pre-op holding area. After transfer to the operating room, a time out was performed. The patient was then prepped and draped. Beginning with a 56 degree blade a paracentesis port was made at the surgeon's 2 o'clock position. The anterior chamber was then filled with 1% non-preserved lidocaine. This was followed by filling the anterior chamber with Provisc.  A 2.54mm keratome blade was used to make a clear corneal incision at the temporal limbus.  A bent cystatome needle was used to create a continuous tear capsulotomy. Hydrodissection was performed with balanced salt solution on a Fine canula. The lens nucleus was then removed using the phacoemulsification handpiece. Residual cortex was removed with the I&A handpiece. The anterior chamber and capsular bag were refilled with Provisc. A posterior chamber intraocular lens was placed into the capsular bag with it's injector. The implant was positioned with the Kuglan hook. The Provisc was then removed from the anterior chamber and capsular bag with the I&A handpiece. Stromal hydration of the main incision and paracentesis port was performed with BSS on a Fine canula. The wounds were tested for leak which was negative. The patient tolerated the procedure well. There were no operative complications. The patient was then transferred to the recovery room in stable condition.  Complications: None  Specimen: None  EBL: None  Prosthetic device: B&L enVista, MX60, power 19.0D, SN  [id number].

## 2013-11-26 NOTE — Anesthesia Preprocedure Evaluation (Signed)
Anesthesia Evaluation  Patient identified by MRN, date of birth, ID band Patient awake    Reviewed: Allergy & Precautions, H&P , NPO status , Patient's Chart, lab work & pertinent test results  Airway Mallampati: II TM Distance: >3 FB     Dental  (+) Edentulous Upper, Edentulous Lower   Pulmonary Current Smoker,    Pulmonary exam normal       Cardiovascular hypertension, Pt. on medications Rhythm:Regular Rate:Normal     Neuro/Psych PSYCHIATRIC DISORDERS Anxiety Depression    GI/Hepatic GERD-  Controlled and Medicated,  Endo/Other  diabetes, Type 2, Insulin Dependent, Oral Hypoglycemic Agents  Renal/GU      Musculoskeletal   Abdominal   Peds  Hematology   Anesthesia Other Findings   Reproductive/Obstetrics                           Anesthesia Physical Anesthesia Plan  ASA: III  Anesthesia Plan: MAC   Post-op Pain Management:    Induction: Intravenous  Airway Management Planned: Nasal Cannula  Additional Equipment:   Intra-op Plan:   Post-operative Plan:   Informed Consent: I have reviewed the patients History and Physical, chart, labs and discussed the procedure including the risks, benefits and alternatives for the proposed anesthesia with the patient or authorized representative who has indicated his/her understanding and acceptance.     Plan Discussed with:   Anesthesia Plan Comments:         Anesthesia Quick Evaluation

## 2013-11-26 NOTE — Anesthesia Postprocedure Evaluation (Signed)
  Anesthesia Post-op Note  Patient: [name] [name]  Procedure(s) Performed: Procedure(s) with comments: CATARACT EXTRACTION PHACO AND INTRAOCULAR LENS PLACEMENT (IOC) (Right) - CDE:14.53  Patient Location: Short Stay  Anesthesia Type:MAC  Level of Consciousness: awake, alert  and oriented  Airway and Oxygen Therapy: Patient Spontanous Breathing  Post-op Pain: none  Post-op Assessment: Post-op Vital signs reviewed, Patient's Cardiovascular Status Stable, Respiratory Function Stable, Patent Airway and No signs of Nausea or vomiting  Post-op Vital Signs: Reviewed and stable  Complications: No apparent anesthesia complications

## 2013-11-27 ENCOUNTER — Encounter (HOSPITAL_COMMUNITY): Payer: Self-pay | Admitting: Ophthalmology

## 2013-12-03 ENCOUNTER — Encounter (HOSPITAL_COMMUNITY): Payer: Self-pay | Admitting: Pharmacy Technician

## 2013-12-03 DIAGNOSIS — H25049 Posterior subcapsular polar age-related cataract, unspecified eye: Secondary | ICD-10-CM | POA: Diagnosis not present

## 2013-12-03 DIAGNOSIS — E119 Type 2 diabetes mellitus without complications: Secondary | ICD-10-CM | POA: Diagnosis not present

## 2013-12-05 ENCOUNTER — Encounter (HOSPITAL_COMMUNITY): Payer: Self-pay

## 2013-12-05 MED ORDER — ONDANSETRON HCL 4 MG/2ML IJ SOLN: 4.0000 mg | Freq: Once | INTRAMUSCULAR | Status: AC | PRN

## 2013-12-05 MED ORDER — FENTANYL CITRATE 0.05 MG/ML IJ SOLN: 25.0000 ug | INTRAMUSCULAR | Status: DC | PRN

## 2013-12-06 ENCOUNTER — Encounter (HOSPITAL_COMMUNITY)
Admission: RE | Admit: 2013-12-06 | Discharge: 2013-12-06 | Disposition: A | Discharge status: Home / Self Care | Payer: Medicare Other | Source: Ambulatory Visit | Attending: Ophthalmology | Admitting: Ophthalmology

## 2013-12-07 DIAGNOSIS — M545 Low back pain, unspecified: Secondary | ICD-10-CM | POA: Diagnosis not present

## 2013-12-07 DIAGNOSIS — M5137 Other intervertebral disc degeneration, lumbosacral region: Secondary | ICD-10-CM | POA: Diagnosis not present

## 2013-12-07 DIAGNOSIS — M5030 Other cervical disc degeneration, unspecified cervical region: Secondary | ICD-10-CM | POA: Diagnosis not present

## 2013-12-10 ENCOUNTER — Encounter (HOSPITAL_COMMUNITY): Payer: Self-pay | Admitting: Ophthalmology

## 2013-12-10 ENCOUNTER — Encounter (HOSPITAL_COMMUNITY)
Admission: RE | Disposition: A | Discharge status: Home / Self Care | Payer: Self-pay | Source: Ambulatory Visit | Attending: Ophthalmology

## 2013-12-10 ENCOUNTER — Encounter (HOSPITAL_COMMUNITY): Payer: Medicare Other | Admitting: Anesthesiology

## 2013-12-10 ENCOUNTER — Ambulatory Visit (HOSPITAL_COMMUNITY)
Admission: RE | Admit: 2013-12-10 | Discharge: 2013-12-10 | Disposition: A | Discharge status: Home / Self Care | Payer: Medicare Other | Source: Ambulatory Visit | Attending: Ophthalmology | Admitting: Ophthalmology

## 2013-12-10 ENCOUNTER — Ambulatory Visit (HOSPITAL_COMMUNITY): Payer: Medicare Other | Admitting: Anesthesiology

## 2013-12-10 DIAGNOSIS — Z794 Long term (current) use of insulin: Secondary | ICD-10-CM | POA: Diagnosis not present

## 2013-12-10 DIAGNOSIS — F17200 Nicotine dependence, unspecified, uncomplicated: Secondary | ICD-10-CM | POA: Diagnosis not present

## 2013-12-10 DIAGNOSIS — I10 Essential (primary) hypertension: Secondary | ICD-10-CM | POA: Diagnosis not present

## 2013-12-10 DIAGNOSIS — H25049 Posterior subcapsular polar age-related cataract, unspecified eye: Secondary | ICD-10-CM | POA: Diagnosis not present

## 2013-12-10 DIAGNOSIS — Z01812 Encounter for preprocedural laboratory examination: Secondary | ICD-10-CM | POA: Insufficient documentation

## 2013-12-10 DIAGNOSIS — F411 Generalized anxiety disorder: Secondary | ICD-10-CM | POA: Insufficient documentation

## 2013-12-10 DIAGNOSIS — Z79899 Other long term (current) drug therapy: Secondary | ICD-10-CM | POA: Insufficient documentation

## 2013-12-10 DIAGNOSIS — H269 Unspecified cataract: Secondary | ICD-10-CM | POA: Diagnosis not present

## 2013-12-10 DIAGNOSIS — E119 Type 2 diabetes mellitus without complications: Secondary | ICD-10-CM | POA: Insufficient documentation

## 2013-12-10 HISTORY — PX: CATARACT EXTRACTION W/PHACO: SHX586

## 2013-12-10 LAB — GLUCOSE, CAPILLARY: Glucose-Capillary: 166 mg/dL — ABNORMAL HIGH (ref 70–99)

## 2013-12-10 SURGERY — PHACOEMULSIFICATION, CATARACT, WITH IOL INSERTION
Anesthesia: Monitor Anesthesia Care | Site: Eye | Laterality: Left

## 2013-12-10 MED ORDER — FENTANYL CITRATE 0.05 MG/ML IJ SOLN: 25.0000 ug | INTRAMUSCULAR | Status: DC | PRN

## 2013-12-10 MED ORDER — BSS IO SOLN
INTRAOCULAR | Status: DC | PRN
  Administered 2013-12-10: 15 mL via INTRAOCULAR

## 2013-12-10 MED ORDER — LACTATED RINGERS IV SOLN
INTRAVENOUS | Status: DC
  Administered 2013-12-10: 1000 mL via INTRAVENOUS

## 2013-12-10 MED ORDER — TETRACAINE HCL 0.5 % OP SOLN
1.0000 [drp] | OPHTHALMIC | Status: AC
  Administered 2013-12-10 (×3): 1 [drp] via OPHTHALMIC

## 2013-12-10 MED ORDER — LIDOCAINE HCL 3.5 % OP GEL
1.0000 "application " | Freq: Once | OPHTHALMIC | Status: AC
  Administered 2013-12-10: 1 via OPHTHALMIC

## 2013-12-10 MED ORDER — ONDANSETRON HCL 4 MG/2ML IJ SOLN: 4.0000 mg | Freq: Once | INTRAMUSCULAR | Status: DC | PRN

## 2013-12-10 MED ORDER — MIDAZOLAM HCL 2 MG/2ML IJ SOLN
INTRAMUSCULAR | Status: AC
  Filled 2013-12-10: qty 2

## 2013-12-10 MED ORDER — EPINEPHRINE HCL 1 MG/ML IJ SOLN
INTRAMUSCULAR | Status: AC
  Filled 2013-12-10: qty 1

## 2013-12-10 MED ORDER — EPINEPHRINE HCL 1 MG/ML IJ SOLN
INTRAOCULAR | Status: DC | PRN
  Administered 2013-12-10: 10:00:00

## 2013-12-10 MED ORDER — MIDAZOLAM HCL 2 MG/2ML IJ SOLN
1.0000 mg | INTRAMUSCULAR | Status: DC | PRN
  Administered 2013-12-10: 2 mg via INTRAVENOUS

## 2013-12-10 MED ORDER — FENTANYL CITRATE 0.05 MG/ML IJ SOLN
25.0000 ug | INTRAMUSCULAR | Status: AC
  Administered 2013-12-10 (×2): 25 ug via INTRAVENOUS

## 2013-12-10 MED ORDER — FENTANYL CITRATE 0.05 MG/ML IJ SOLN
INTRAMUSCULAR | Status: AC
  Filled 2013-12-10: qty 2

## 2013-12-10 MED ORDER — NEOMYCIN-POLYMYXIN-DEXAMETH 3.5-10000-0.1 OP SUSP
OPHTHALMIC | Status: DC | PRN
  Administered 2013-12-10: 1 [drp] via OPHTHALMIC

## 2013-12-10 MED ORDER — PHENYLEPHRINE HCL 2.5 % OP SOLN
1.0000 [drp] | OPHTHALMIC | Status: AC
  Administered 2013-12-10 (×3): 1 [drp] via OPHTHALMIC

## 2013-12-10 MED ORDER — POVIDONE-IODINE 5 % OP SOLN
OPHTHALMIC | Status: DC | PRN
  Administered 2013-12-10: 1 via OPHTHALMIC

## 2013-12-10 MED ORDER — PROVISC 10 MG/ML IO SOLN
INTRAOCULAR | Status: DC | PRN
  Administered 2013-12-10: 0.85 mL via INTRAOCULAR

## 2013-12-10 MED ORDER — CYCLOPENTOLATE-PHENYLEPHRINE 0.2-1 % OP SOLN
1.0000 [drp] | OPHTHALMIC | Status: AC
Start: 2013-12-10 — End: 2013-12-10
  Administered 2013-12-10 (×3): 1 [drp] via OPHTHALMIC

## 2013-12-10 MED ORDER — LIDOCAINE HCL (PF) 1 % IJ SOLN
INTRAMUSCULAR | Status: DC | PRN
  Administered 2013-12-10: .4 mL

## 2013-12-10 SURGICAL SUPPLY — 33 items
CAPSULAR TENSION RING-AMO (OPHTHALMIC RELATED) IMPLANT
CLOTH BEACON ORANGE TIMEOUT ST (SAFETY) ×2 IMPLANT
EYE SHIELD UNIVERSAL CLEAR (GAUZE/BANDAGES/DRESSINGS) ×2 IMPLANT
GLOVE BIO SURGEON STRL SZ 6.5 (GLOVE) IMPLANT
GLOVE BIO SURGEONS STRL SZ 6.5 (GLOVE)
GLOVE BIOGEL PI IND STRL 6.5 (GLOVE) IMPLANT
GLOVE BIOGEL PI IND STRL 7.0 (GLOVE) IMPLANT
GLOVE BIOGEL PI IND STRL 7.5 (GLOVE) IMPLANT
GLOVE BIOGEL PI INDICATOR 6.5 (GLOVE)
GLOVE BIOGEL PI INDICATOR 7.0 (GLOVE) ×4
GLOVE BIOGEL PI INDICATOR 7.5 (GLOVE)
GLOVE ECLIPSE 6.5 STRL STRAW (GLOVE) IMPLANT
GLOVE ECLIPSE 7.0 STRL STRAW (GLOVE) IMPLANT
GLOVE ECLIPSE 7.5 STRL STRAW (GLOVE) IMPLANT
GLOVE EXAM NITRILE LRG STRL (GLOVE) IMPLANT
GLOVE EXAM NITRILE MD LF STRL (GLOVE) IMPLANT
GLOVE SKINSENSE NS SZ6.5 (GLOVE)
GLOVE SKINSENSE NS SZ7.0 (GLOVE)
GLOVE SKINSENSE STRL SZ6.5 (GLOVE) IMPLANT
GLOVE SKINSENSE STRL SZ7.0 (GLOVE) IMPLANT
KIT VITRECTOMY (OPHTHALMIC RELATED) IMPLANT
PAD ARMBOARD 7.5X6 YLW CONV (MISCELLANEOUS) ×2 IMPLANT
PROC W NO LENS (INTRAOCULAR LENS)
PROC W SPEC LENS (INTRAOCULAR LENS)
PROCESS W NO LENS (INTRAOCULAR LENS) IMPLANT
PROCESS W SPEC LENS (INTRAOCULAR LENS) IMPLANT
RING MALYGIN (MISCELLANEOUS) IMPLANT
SIGHTPATH CAT PROC W REG LENS (Ophthalmic Related) ×3 IMPLANT
SYR TB 1ML LL NO SAFETY (SYRINGE) ×2 IMPLANT
TAPE SURG TRANSPORE 1 IN (GAUZE/BANDAGES/DRESSINGS) IMPLANT
TAPE SURGICAL TRANSPORE 1 IN (GAUZE/BANDAGES/DRESSINGS) ×2
VISCOELASTIC ADDITIONAL (OPHTHALMIC RELATED) IMPLANT
WATER STERILE IRR 250ML POUR (IV SOLUTION) ×2 IMPLANT

## 2013-12-10 NOTE — Anesthesia Postprocedure Evaluation (Signed)
  Anesthesia Post-op Note  Patient: [name] [name]  Procedure(s) Performed: Procedure(s) with comments: CATARACT EXTRACTION PHACO AND INTRAOCULAR LENS PLACEMENT (IOC) (Left) - CDE: 9.26  Patient Location: Short Stay  Anesthesia Type:MAC  Level of Consciousness: awake, alert  and oriented  Airway and Oxygen Therapy: Patient Spontanous Breathing  Post-op Pain: none  Post-op Assessment: Post-op Vital signs reviewed, Patient's Cardiovascular Status Stable, Respiratory Function Stable, Patent Airway and No signs of Nausea or vomiting  Post-op Vital Signs: Reviewed and stable  Complications: No apparent anesthesia complications

## 2013-12-10 NOTE — Transfer of Care (Signed)
Immediate Anesthesia Transfer of Care Note  Patient: [name] [name]  Procedure(s) Performed: Procedure(s) with comments: CATARACT EXTRACTION PHACO AND INTRAOCULAR LENS PLACEMENT (IOC) (Left) - CDE: 9.26  Patient Location: PACU  Anesthesia Type:MAC  Level of Consciousness: awake  Airway & Oxygen Therapy: Patient Spontanous Breathing  Post-op Assessment: Report given to PACU RN  Post vital signs: Reviewed  Complications: No apparent anesthesia complications

## 2013-12-10 NOTE — Op Note (Signed)
Date of Admission: 12/10/2013  Date of Surgery: 12/10/2013  Pre-Op Dx: Cataract  Left  Eye  Post-Op Dx: Cataract  Left  Eye,  Dx Code 366.14  Surgeon: Tonny Branch, M.D.  Assistants: None  Anesthesia: Topical with MAC  Indications: Painless, progressive loss of vision with compromise of daily activities.  Surgery: Cataract Extraction with Intraocular lens Implant Left Eye  Discription: The patient had dilating drops and viscous lidocaine placed into the left eye in the pre-op holding area. After transfer to the operating room, a time out was performed. The patient was then prepped and draped. Beginning with a 34 degree blade a paracentesis port was made at the surgeon's 2 o'clock position. The anterior chamber was then filled with 1% non-preserved lidocaine. This was followed by filling the anterior chamber with Provisc. A 2.48mm keratome blade was used to make a clear corneal incision at the temporal limbus. A bent cystatome needle was used to create a continuous tear capsulotomy. Hydrodissection was performed with balanced salt solution on a Fine canula. The lens nucleus was then removed using the phacoemulsification handpiece. Residual cortex was removed with the I&A handpiece. The anterior chamber and capsular bag were refilled with Provisc. A posterior chamber intraocular lens was placed into the capsular bag with it's injector. The implant was positioned with the Kuglan hook. The Provisc was then removed from the anterior chamber and capsular bag with the I&A handpiece. Stromal hydration of the main incision and paracentesis port was performed with BSS on a Fine canula. The wounds were tested for leak which was negative. The patient tolerated the procedure well. There were no operative complications. The patient was then transferred to the recovery room in stable condition.  Complications: None  Specimen: None  EBL: None  Prosthetic device: B&L enVista, MX60, power 20.0D, SN [id number].

## 2013-12-10 NOTE — Anesthesia Preprocedure Evaluation (Signed)
Anesthesia Evaluation  Patient identified by MRN, date of birth, ID band Patient awake    Reviewed: Allergy & Precautions, H&P , NPO status , Patient's Chart, lab work & pertinent test results  Airway Mallampati: II TM Distance: >3 FB     Dental  (+) Edentulous Upper, Edentulous Lower   Pulmonary Current Smoker,    Pulmonary exam normal       Cardiovascular hypertension, Pt. on medications Rhythm:Regular Rate:Normal     Neuro/Psych PSYCHIATRIC DISORDERS Anxiety Depression    GI/Hepatic GERD-  Controlled and Medicated,  Endo/Other  diabetes, Type 2, Insulin Dependent, Oral Hypoglycemic Agents  Renal/GU      Musculoskeletal   Abdominal   Peds  Hematology   Anesthesia Other Findings   Reproductive/Obstetrics                           Anesthesia Physical Anesthesia Plan  ASA: III  Anesthesia Plan: MAC   Post-op Pain Management:    Induction: Intravenous  Airway Management Planned: Nasal Cannula  Additional Equipment:   Intra-op Plan:   Post-operative Plan:   Informed Consent: I have reviewed the patients History and Physical, chart, labs and discussed the procedure including the risks, benefits and alternatives for the proposed anesthesia with the patient or authorized representative who has indicated his/her understanding and acceptance.     Plan Discussed with:   Anesthesia Plan Comments:         Anesthesia Quick Evaluation  

## 2013-12-10 NOTE — Consult Note (Signed)
I have reviewed the H&P, the patient was re-examined, and I have identified no interval changes in medical condition and plan of care since the history and physical of record  

## 2013-12-10 NOTE — Discharge Instructions (Signed)

## 2013-12-11 ENCOUNTER — Encounter (HOSPITAL_COMMUNITY): Payer: Self-pay | Admitting: Ophthalmology

## 2013-12-11 DIAGNOSIS — I10 Essential (primary) hypertension: Secondary | ICD-10-CM | POA: Diagnosis not present

## 2013-12-11 DIAGNOSIS — J449 Chronic obstructive pulmonary disease, unspecified: Secondary | ICD-10-CM | POA: Diagnosis not present

## 2013-12-11 DIAGNOSIS — E119 Type 2 diabetes mellitus without complications: Secondary | ICD-10-CM | POA: Diagnosis not present

## 2013-12-26 DIAGNOSIS — E559 Vitamin D deficiency, unspecified: Secondary | ICD-10-CM | POA: Diagnosis not present

## 2013-12-26 DIAGNOSIS — IMO0002 Reserved for concepts with insufficient information to code with codable children: Secondary | ICD-10-CM | POA: Diagnosis not present

## 2013-12-26 DIAGNOSIS — E039 Hypothyroidism, unspecified: Secondary | ICD-10-CM | POA: Diagnosis not present

## 2013-12-26 DIAGNOSIS — E1065 Type 1 diabetes mellitus with hyperglycemia: Secondary | ICD-10-CM | POA: Diagnosis not present

## 2014-01-03 DIAGNOSIS — I10 Essential (primary) hypertension: Secondary | ICD-10-CM | POA: Diagnosis not present

## 2014-01-03 DIAGNOSIS — Z9119 Patient's noncompliance with other medical treatment and regimen: Secondary | ICD-10-CM | POA: Diagnosis not present

## 2014-01-03 DIAGNOSIS — E1065 Type 1 diabetes mellitus with hyperglycemia: Secondary | ICD-10-CM | POA: Diagnosis not present

## 2014-01-03 DIAGNOSIS — Z91199 Patient's noncompliance with other medical treatment and regimen due to unspecified reason: Secondary | ICD-10-CM | POA: Diagnosis not present

## 2014-01-03 DIAGNOSIS — K8689 Other specified diseases of pancreas: Secondary | ICD-10-CM | POA: Diagnosis not present

## 2014-01-03 DIAGNOSIS — Z87891 Personal history of nicotine dependence: Secondary | ICD-10-CM | POA: Diagnosis not present

## 2014-01-03 DIAGNOSIS — E039 Hypothyroidism, unspecified: Secondary | ICD-10-CM | POA: Diagnosis not present

## 2014-01-03 DIAGNOSIS — E785 Hyperlipidemia, unspecified: Secondary | ICD-10-CM | POA: Diagnosis not present

## 2014-01-03 DIAGNOSIS — IMO0002 Reserved for concepts with insufficient information to code with codable children: Secondary | ICD-10-CM | POA: Diagnosis not present

## 2014-01-04 DIAGNOSIS — M545 Low back pain, unspecified: Secondary | ICD-10-CM | POA: Diagnosis not present

## 2014-01-04 DIAGNOSIS — M5137 Other intervertebral disc degeneration, lumbosacral region: Secondary | ICD-10-CM | POA: Diagnosis not present

## 2014-01-17 DIAGNOSIS — S058X9A Other injuries of unspecified eye and orbit, initial encounter: Secondary | ICD-10-CM | POA: Diagnosis not present

## 2014-01-31 DIAGNOSIS — M545 Low back pain, unspecified: Secondary | ICD-10-CM | POA: Diagnosis not present

## 2014-01-31 DIAGNOSIS — M5430 Sciatica, unspecified side: Secondary | ICD-10-CM | POA: Diagnosis not present

## 2014-01-31 DIAGNOSIS — G894 Chronic pain syndrome: Secondary | ICD-10-CM | POA: Diagnosis not present

## 2014-01-31 DIAGNOSIS — M533 Sacrococcygeal disorders, not elsewhere classified: Secondary | ICD-10-CM | POA: Diagnosis not present

## 2014-01-31 DIAGNOSIS — Z79899 Other long term (current) drug therapy: Secondary | ICD-10-CM | POA: Diagnosis not present

## 2014-02-28 DIAGNOSIS — M545 Low back pain, unspecified: Secondary | ICD-10-CM | POA: Diagnosis not present

## 2014-02-28 DIAGNOSIS — M5430 Sciatica, unspecified side: Secondary | ICD-10-CM | POA: Diagnosis not present

## 2014-02-28 DIAGNOSIS — M533 Sacrococcygeal disorders, not elsewhere classified: Secondary | ICD-10-CM | POA: Diagnosis not present

## 2014-02-28 DIAGNOSIS — Z79899 Other long term (current) drug therapy: Secondary | ICD-10-CM | POA: Diagnosis not present

## 2014-02-28 DIAGNOSIS — G894 Chronic pain syndrome: Secondary | ICD-10-CM | POA: Diagnosis not present

## 2014-02-28 DIAGNOSIS — G541 Lumbosacral plexus disorders: Secondary | ICD-10-CM | POA: Diagnosis not present

## 2014-03-28 DIAGNOSIS — M546 Pain in thoracic spine: Secondary | ICD-10-CM | POA: Diagnosis not present

## 2014-03-28 DIAGNOSIS — G894 Chronic pain syndrome: Secondary | ICD-10-CM | POA: Diagnosis not present

## 2014-03-28 DIAGNOSIS — M545 Low back pain, unspecified: Secondary | ICD-10-CM | POA: Diagnosis not present

## 2014-03-28 DIAGNOSIS — Z79899 Other long term (current) drug therapy: Secondary | ICD-10-CM | POA: Diagnosis not present

## 2014-04-09 DIAGNOSIS — E119 Type 2 diabetes mellitus without complications: Secondary | ICD-10-CM | POA: Diagnosis not present

## 2014-04-09 DIAGNOSIS — I10 Essential (primary) hypertension: Secondary | ICD-10-CM | POA: Diagnosis not present

## 2014-04-09 DIAGNOSIS — K315 Obstruction of duodenum: Secondary | ICD-10-CM | POA: Diagnosis not present

## 2014-04-09 DIAGNOSIS — E1149 Type 2 diabetes mellitus with other diabetic neurological complication: Secondary | ICD-10-CM | POA: Diagnosis not present

## 2014-04-10 DIAGNOSIS — E785 Hyperlipidemia, unspecified: Secondary | ICD-10-CM | POA: Diagnosis not present

## 2014-04-10 DIAGNOSIS — E1129 Type 2 diabetes mellitus with other diabetic kidney complication: Secondary | ICD-10-CM | POA: Diagnosis not present

## 2014-04-10 DIAGNOSIS — I10 Essential (primary) hypertension: Secondary | ICD-10-CM | POA: Diagnosis not present

## 2014-05-03 DIAGNOSIS — M533 Sacrococcygeal disorders, not elsewhere classified: Secondary | ICD-10-CM | POA: Diagnosis not present

## 2014-05-03 DIAGNOSIS — M5137 Other intervertebral disc degeneration, lumbosacral region: Secondary | ICD-10-CM | POA: Diagnosis not present

## 2014-05-03 DIAGNOSIS — M545 Low back pain, unspecified: Secondary | ICD-10-CM | POA: Diagnosis not present

## 2014-05-30 DIAGNOSIS — M533 Sacrococcygeal disorders, not elsewhere classified: Secondary | ICD-10-CM | POA: Diagnosis not present

## 2014-05-30 DIAGNOSIS — M5430 Sciatica, unspecified side: Secondary | ICD-10-CM | POA: Diagnosis not present

## 2014-05-30 DIAGNOSIS — M545 Low back pain, unspecified: Secondary | ICD-10-CM | POA: Diagnosis not present

## 2014-06-07 DIAGNOSIS — E1142 Type 2 diabetes mellitus with diabetic polyneuropathy: Secondary | ICD-10-CM | POA: Diagnosis not present

## 2014-06-07 DIAGNOSIS — I10 Essential (primary) hypertension: Secondary | ICD-10-CM | POA: Diagnosis not present

## 2014-06-07 DIAGNOSIS — IMO0001 Reserved for inherently not codable concepts without codable children: Secondary | ICD-10-CM | POA: Diagnosis not present

## 2014-06-07 DIAGNOSIS — F17200 Nicotine dependence, unspecified, uncomplicated: Secondary | ICD-10-CM | POA: Diagnosis not present

## 2014-06-07 DIAGNOSIS — E7800 Pure hypercholesterolemia, unspecified: Secondary | ICD-10-CM | POA: Diagnosis not present

## 2014-06-07 DIAGNOSIS — Z23 Encounter for immunization: Secondary | ICD-10-CM | POA: Diagnosis not present

## 2014-07-01 DIAGNOSIS — M5430 Sciatica, unspecified side: Secondary | ICD-10-CM | POA: Diagnosis not present

## 2014-07-01 DIAGNOSIS — M545 Low back pain, unspecified: Secondary | ICD-10-CM | POA: Diagnosis not present

## 2014-07-01 DIAGNOSIS — M533 Sacrococcygeal disorders, not elsewhere classified: Secondary | ICD-10-CM | POA: Diagnosis not present

## 2014-07-09 DIAGNOSIS — E780 Pure hypercholesterolemia: Secondary | ICD-10-CM | POA: Diagnosis not present

## 2014-07-09 DIAGNOSIS — I10 Essential (primary) hypertension: Secondary | ICD-10-CM | POA: Diagnosis not present

## 2014-07-09 DIAGNOSIS — L089 Local infection of the skin and subcutaneous tissue, unspecified: Secondary | ICD-10-CM | POA: Diagnosis not present

## 2014-07-09 DIAGNOSIS — E119 Type 2 diabetes mellitus without complications: Secondary | ICD-10-CM | POA: Diagnosis not present

## 2014-07-25 DIAGNOSIS — Z79891 Long term (current) use of opiate analgesic: Secondary | ICD-10-CM | POA: Diagnosis not present

## 2014-07-25 DIAGNOSIS — M545 Low back pain: Secondary | ICD-10-CM | POA: Diagnosis not present

## 2014-08-15 DIAGNOSIS — E1165 Type 2 diabetes mellitus with hyperglycemia: Secondary | ICD-10-CM | POA: Diagnosis not present

## 2014-08-19 ENCOUNTER — Encounter: Payer: Medicare Other | Attending: "Endocrinology | Admitting: Nutrition

## 2014-08-19 VITALS — Ht 66.0 in | Wt 137.0 lb

## 2014-08-19 DIAGNOSIS — Z713 Dietary counseling and surveillance: Secondary | ICD-10-CM | POA: Insufficient documentation

## 2014-08-19 DIAGNOSIS — E108 Type 1 diabetes mellitus with unspecified complications: Secondary | ICD-10-CM | POA: Insufficient documentation

## 2014-08-19 DIAGNOSIS — Z9119 Patient's noncompliance with other medical treatment and regimen: Secondary | ICD-10-CM | POA: Diagnosis not present

## 2014-08-19 DIAGNOSIS — K868 Other specified diseases of pancreas: Secondary | ICD-10-CM | POA: Diagnosis not present

## 2014-08-19 DIAGNOSIS — Z794 Long term (current) use of insulin: Secondary | ICD-10-CM | POA: Diagnosis not present

## 2014-08-19 DIAGNOSIS — E039 Hypothyroidism, unspecified: Secondary | ICD-10-CM | POA: Diagnosis not present

## 2014-08-19 DIAGNOSIS — E1122 Type 2 diabetes mellitus with diabetic chronic kidney disease: Secondary | ICD-10-CM | POA: Diagnosis not present

## 2014-08-26 ENCOUNTER — Encounter: Payer: Medicare Other | Attending: "Endocrinology | Admitting: Nutrition

## 2014-08-26 ENCOUNTER — Encounter: Payer: Self-pay | Admitting: Nutrition

## 2014-08-26 VITALS — Ht 66.0 in | Wt 137.8 lb

## 2014-08-26 DIAGNOSIS — G603 Idiopathic progressive neuropathy: Secondary | ICD-10-CM | POA: Diagnosis not present

## 2014-08-26 DIAGNOSIS — R861 Abnormal level of hormones in specimens from male genital organs: Secondary | ICD-10-CM | POA: Diagnosis not present

## 2014-08-26 DIAGNOSIS — E1022 Type 1 diabetes mellitus with diabetic chronic kidney disease: Secondary | ICD-10-CM | POA: Diagnosis not present

## 2014-08-26 DIAGNOSIS — E108 Type 1 diabetes mellitus with unspecified complications: Secondary | ICD-10-CM | POA: Diagnosis not present

## 2014-08-26 DIAGNOSIS — E1165 Type 2 diabetes mellitus with hyperglycemia: Secondary | ICD-10-CM

## 2014-08-26 DIAGNOSIS — M545 Low back pain: Secondary | ICD-10-CM | POA: Diagnosis not present

## 2014-08-26 DIAGNOSIS — R1013 Epigastric pain: Secondary | ICD-10-CM | POA: Diagnosis not present

## 2014-08-26 DIAGNOSIS — IMO0002 Reserved for concepts with insufficient information to code with codable children: Secondary | ICD-10-CM

## 2014-08-26 DIAGNOSIS — G8929 Other chronic pain: Secondary | ICD-10-CM | POA: Diagnosis not present

## 2014-08-26 DIAGNOSIS — Z713 Dietary counseling and surveillance: Secondary | ICD-10-CM | POA: Insufficient documentation

## 2014-08-26 DIAGNOSIS — E118 Type 2 diabetes mellitus with unspecified complications: Secondary | ICD-10-CM

## 2014-08-26 DIAGNOSIS — Z794 Long term (current) use of insulin: Secondary | ICD-10-CM | POA: Diagnosis not present

## 2014-08-26 NOTE — Patient Instructions (Signed)
Plan:  Aim for 3-4 Carb Choices per meal (45-60 grams) +/- 1 either way  No snacks between meals unless having a low blood sugar. Do not skip meals Take Creon with meals for proper absorption of food Cut out sugared cereal and sweets and soda. Take insulin as prescribed. Increase water intake to 5 bottles per day. Consider  increasing your activity level by 15 for 30 minutes daily as tolerated Consider checking BG at alternate times per day as directed by MD  Consider taking medication of Novolog and Toujeo as directed by MD  Goal: Get A1C down below 8% in three months.

## 2014-08-26 NOTE — Progress Notes (Signed)
  Medical Nutrition Therapy:  Appt start time: 1130 end time:  1200.   Assessment:  Primary concerns today: Diabetes. Here with his wife. 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 Has not been taking insulin as instructed. He has been losing weight. Feels tired, thirsty and depressed at times. Pt. Not able to read or write well. Drinks sodas, eats sugar cereal and eats whatever he wants. Sometimes skips meals and then later may eat lots of food..Most recent A1C was 10.1% and was down to 8.9% at one time. He continues to smoke but says he is willing to quit. Referred to the 1-800 QUIT Now line. His wife smokes too and if she will quit, he says he will.   Preferred Learning Style:    No preference indicated   Learning Readiness:    Not ready  Ready  Change in progress   MEDICATIONS: Toujeo 35 units with meals;  And Novolog 5 units with meals   DIETARY INTAKE:  24-hr recall:  B ( AM): usually skips because he sleeps in  Snk ( AM): nothing  L ( PM): Sugared cereal, toast, juice Snk ( PM): whateve, chips, candy, soda, water D ( PM): meat, vegetable; Soda Snk ( PM): anything Beverages: water, sodas, koolaid, juice  Usual physical activity: none  Estimated energy needs: 2000 calories 250 g carbohydrates 120 g protein 75  g fat  Progress Towards Goal(s):  In progress.   Nutritional Diagnosis:  NB-1.1 Food and nutrition-related knowledge deficit As related to Diabetes.  As evidenced by A1C >9%.    Intervention:  Nutrition Counseling and diabetes education on diet, meal planning and portion control and complications of diabetes..  Teaching Method Utilized:   Visual Auditory Hands on  Handout given                 The Plate Method   Meal Plan Card     Plan:  Aim for 3-4 Carb Choices per meal (45-60 grams) +/- 1 either way  No snacks between meals unless having a low blood sugar. Do not skip meals Take Creon with meals for proper absorption of  food Cut out sugared cereal and sweets and soda. Take insulin as prescribed. Increase water intake to 5 bottles per day. Consider  increasing your activity level by 15 for 30 minutes daily as tolerated Consider checking BG at alternate times per day as directed by MD  Consider taking medication of Novolog and Toujeo as directed by MD  Goal: Get A1C down below 8% in three months.   Barriers to learning/adherence to lifestyle change: none  Demonstrated degree of understanding via:  Teach Back   Monitoring/Evaluation:  Dietary intake, exercise, meal planning, SBG, and body weight in 1 week(s).

## 2014-08-26 NOTE — Patient Instructions (Signed)
Plan:  Aim for 3-4 Carb Choices per meal (45-60 grams) +/- 1 either way  No snacks between meals. Continue meal schedule of 9am, 1 pm and 5-6 pm for meals. Include protein in moderation with your meals Treat low bloods sugars with 15-30 g of CHO as needed. Call Dr. Dorris Fetch if BS less than 70 mg/dl. Consider reading food labels for Total Carbohydrate and Fat Grams of foods Continue checking blood sugars before meal and at bedtime daily and record on sheets. Reduce Toujeo to 26 units daily and 5 units of Novolog with meals. Do not take Novolog if not eating a meal. Do not give Novolog if BS is less than 90 mg/dl. Goal: Get A1C to 7% in three months. 2. Eat three balanced meals per day about the same time every day.

## 2014-08-26 NOTE — Progress Notes (Signed)
  Medical Nutrition Therapy:  Appt start time: 1530end time:  1600.   Assessment:  Primary concerns today:Follow up DM  Here with his wife. Dr. Dorris Fetch reduced medications due to low blood sugars of 45 mg/dl a few times.  He is eating much better. Cut out sodas, junk food and now eating more fresh fruits or vegetables. Hasn't been getting enough CHO at meals (only 15 or 30 g sometimes) and causing lower blood sugars. Feels much better; not as tired, thirsty or sleepy or depressed feeling like he was before now.  Has cut out sugared cereal and soda.  Dr. Dorris Fetch reduced Toujeo to 26 units per day and now only taking 5 units of Novolog with meals.    Preferred Learning Style:  Auditory  Visual  Hands on   Learning Readiness:   Not ready  Contemplating  Ready  Change in progress    MEDICATIONS: see list.  DIETARY INTAKE:  24-hr recall:  B ( AM): skipped this morning due to MD appts.  Oatmeal, 1/2 c milk and few raisins; coffee  cheese/egg sandwich OR   Snk ( AM):  L ( PM): 1/2 sandwich, 1 piece of fruit, water Snk ( PM):  Juice and canteloupe due to low blood sugars of 45 mg/dl D ( PM): Hamburger steak, mac/cheeese, and fruit, water Snk ( PM): none Beverages: water, crystal light  Usual physical activity: walking some  Estimated energy needs: 2000 calories 225 g carbohydrates 150 g protein 56 g fat  Progress Towards Goal(s):  In progress.   Nutritional Diagnosis:  NB-1.1 Food and nutrition-related knowledge deficit As related to Diabetes.  As evidenced by A1C > 8%.    Intervention:  Nutrition counseling and diabetes education. Plan:  Aim for 3-4 Carb Choices per meal (45-60 grams) +/- 1 either way  No snacks between meals. Continue meal schedule of 9am, 1 pm and 5-6 pm for meals. Include protein in moderation with your meals Treat low bloods sugars with 15-30 g of CHO as needed. Call Dr. Dorris Fetch if BS less than 70 mg/dl. Consider reading food labels for Total  Carbohydrate and Fat Grams of foods Continue checking blood sugars before meal and at bedtime daily and record on sheets. Reduce Toujeo to 26 units daily and 5 units of Novolog with meals. Do not take Novolog if not eating a meal. Do not give Novolog if BS is less than 90 mg/dl. Goal: Get A1C to 7% in three months. 2. Eat three balanced meals per day about the same time every day.  Teaching Method Utilized:  Visual Auditory Hands on  Handouts given during visit include: The Plate Method Meal Plan Card   Barriers to learning/adherence to lifestyle change: none  Demonstrated degree of understanding via:  Teach Back   Monitoring/Evaluation:  Dietary intake, exercise, meal planning, and body weight in 6 week(s).

## 2014-08-27 DIAGNOSIS — H61009 Unspecified perichondritis of external ear, unspecified ear: Secondary | ICD-10-CM | POA: Diagnosis not present

## 2014-09-05 ENCOUNTER — Encounter (HOSPITAL_COMMUNITY): Payer: Self-pay | Admitting: Ophthalmology

## 2014-09-23 DIAGNOSIS — M545 Low back pain: Secondary | ICD-10-CM | POA: Diagnosis not present

## 2014-09-23 DIAGNOSIS — G8929 Other chronic pain: Secondary | ICD-10-CM | POA: Diagnosis not present

## 2014-09-23 DIAGNOSIS — Z79891 Long term (current) use of opiate analgesic: Secondary | ICD-10-CM | POA: Diagnosis not present

## 2014-09-23 DIAGNOSIS — K861 Other chronic pancreatitis: Secondary | ICD-10-CM | POA: Diagnosis not present

## 2014-10-08 DIAGNOSIS — E119 Type 2 diabetes mellitus without complications: Secondary | ICD-10-CM | POA: Diagnosis not present

## 2014-10-08 DIAGNOSIS — F17200 Nicotine dependence, unspecified, uncomplicated: Secondary | ICD-10-CM | POA: Diagnosis not present

## 2014-10-08 DIAGNOSIS — J449 Chronic obstructive pulmonary disease, unspecified: Secondary | ICD-10-CM | POA: Diagnosis not present

## 2014-10-08 DIAGNOSIS — F329 Major depressive disorder, single episode, unspecified: Secondary | ICD-10-CM | POA: Diagnosis not present

## 2014-10-09 ENCOUNTER — Encounter: Payer: Medicare Other | Attending: "Endocrinology | Admitting: Nutrition

## 2014-10-09 DIAGNOSIS — Z713 Dietary counseling and surveillance: Secondary | ICD-10-CM | POA: Insufficient documentation

## 2014-10-09 DIAGNOSIS — Z794 Long term (current) use of insulin: Secondary | ICD-10-CM | POA: Insufficient documentation

## 2014-10-09 DIAGNOSIS — E108 Type 1 diabetes mellitus with unspecified complications: Secondary | ICD-10-CM | POA: Insufficient documentation

## 2014-10-24 DIAGNOSIS — G8929 Other chronic pain: Secondary | ICD-10-CM | POA: Diagnosis not present

## 2014-10-24 DIAGNOSIS — M545 Low back pain: Secondary | ICD-10-CM | POA: Diagnosis not present

## 2014-11-13 DIAGNOSIS — E1065 Type 1 diabetes mellitus with hyperglycemia: Secondary | ICD-10-CM | POA: Diagnosis not present

## 2014-11-19 DIAGNOSIS — G8929 Other chronic pain: Secondary | ICD-10-CM | POA: Diagnosis not present

## 2014-11-19 DIAGNOSIS — M545 Low back pain: Secondary | ICD-10-CM | POA: Diagnosis not present

## 2014-11-19 DIAGNOSIS — K861 Other chronic pancreatitis: Secondary | ICD-10-CM | POA: Diagnosis not present

## 2014-11-25 DIAGNOSIS — E039 Hypothyroidism, unspecified: Secondary | ICD-10-CM | POA: Diagnosis not present

## 2014-11-25 DIAGNOSIS — E1065 Type 1 diabetes mellitus with hyperglycemia: Secondary | ICD-10-CM | POA: Diagnosis not present

## 2014-11-25 DIAGNOSIS — K868 Other specified diseases of pancreas: Secondary | ICD-10-CM | POA: Diagnosis not present

## 2014-11-25 DIAGNOSIS — I10 Essential (primary) hypertension: Secondary | ICD-10-CM | POA: Diagnosis not present

## 2014-11-29 DIAGNOSIS — Z0001 Encounter for general adult medical examination with abnormal findings: Secondary | ICD-10-CM | POA: Diagnosis not present

## 2014-11-29 DIAGNOSIS — F17200 Nicotine dependence, unspecified, uncomplicated: Secondary | ICD-10-CM | POA: Diagnosis not present

## 2014-11-29 DIAGNOSIS — J449 Chronic obstructive pulmonary disease, unspecified: Secondary | ICD-10-CM | POA: Diagnosis not present

## 2014-11-29 DIAGNOSIS — I10 Essential (primary) hypertension: Secondary | ICD-10-CM | POA: Diagnosis not present

## 2014-12-18 DIAGNOSIS — G8929 Other chronic pain: Secondary | ICD-10-CM | POA: Diagnosis not present

## 2014-12-18 DIAGNOSIS — R1010 Upper abdominal pain, unspecified: Secondary | ICD-10-CM | POA: Diagnosis not present

## 2014-12-18 DIAGNOSIS — K861 Other chronic pancreatitis: Secondary | ICD-10-CM | POA: Diagnosis not present

## 2014-12-18 DIAGNOSIS — M545 Low back pain: Secondary | ICD-10-CM | POA: Diagnosis not present

## 2015-01-16 DIAGNOSIS — M545 Low back pain: Secondary | ICD-10-CM | POA: Diagnosis not present

## 2015-01-16 DIAGNOSIS — M546 Pain in thoracic spine: Secondary | ICD-10-CM | POA: Diagnosis not present

## 2015-01-16 DIAGNOSIS — G8929 Other chronic pain: Secondary | ICD-10-CM | POA: Diagnosis not present

## 2015-01-30 DIAGNOSIS — J449 Chronic obstructive pulmonary disease, unspecified: Secondary | ICD-10-CM | POA: Diagnosis not present

## 2015-01-30 DIAGNOSIS — I10 Essential (primary) hypertension: Secondary | ICD-10-CM | POA: Diagnosis not present

## 2015-02-17 DIAGNOSIS — E039 Hypothyroidism, unspecified: Secondary | ICD-10-CM | POA: Diagnosis not present

## 2015-02-17 DIAGNOSIS — E1065 Type 1 diabetes mellitus with hyperglycemia: Secondary | ICD-10-CM | POA: Diagnosis not present

## 2015-02-17 DIAGNOSIS — E782 Mixed hyperlipidemia: Secondary | ICD-10-CM | POA: Diagnosis not present

## 2015-02-17 DIAGNOSIS — I10 Essential (primary) hypertension: Secondary | ICD-10-CM | POA: Diagnosis not present

## 2015-02-20 DIAGNOSIS — K861 Other chronic pancreatitis: Secondary | ICD-10-CM | POA: Diagnosis not present

## 2015-02-20 DIAGNOSIS — G8929 Other chronic pain: Secondary | ICD-10-CM | POA: Diagnosis not present

## 2015-02-20 DIAGNOSIS — M545 Low back pain: Secondary | ICD-10-CM | POA: Diagnosis not present

## 2015-02-20 DIAGNOSIS — R1010 Upper abdominal pain, unspecified: Secondary | ICD-10-CM | POA: Diagnosis not present

## 2015-03-01 DIAGNOSIS — E1140 Type 2 diabetes mellitus with diabetic neuropathy, unspecified: Secondary | ICD-10-CM | POA: Diagnosis not present

## 2015-03-01 DIAGNOSIS — E785 Hyperlipidemia, unspecified: Secondary | ICD-10-CM | POA: Diagnosis not present

## 2015-03-01 DIAGNOSIS — J449 Chronic obstructive pulmonary disease, unspecified: Secondary | ICD-10-CM | POA: Diagnosis not present

## 2015-03-20 DIAGNOSIS — G8929 Other chronic pain: Secondary | ICD-10-CM | POA: Diagnosis not present

## 2015-03-20 DIAGNOSIS — M545 Low back pain: Secondary | ICD-10-CM | POA: Diagnosis not present

## 2015-04-01 DIAGNOSIS — J449 Chronic obstructive pulmonary disease, unspecified: Secondary | ICD-10-CM | POA: Diagnosis not present

## 2015-04-01 DIAGNOSIS — I10 Essential (primary) hypertension: Secondary | ICD-10-CM | POA: Diagnosis not present

## 2015-04-23 DIAGNOSIS — G603 Idiopathic progressive neuropathy: Secondary | ICD-10-CM | POA: Diagnosis not present

## 2015-04-23 DIAGNOSIS — M545 Low back pain: Secondary | ICD-10-CM | POA: Diagnosis not present

## 2015-04-23 DIAGNOSIS — G890 Central pain syndrome: Secondary | ICD-10-CM | POA: Diagnosis not present

## 2015-04-23 DIAGNOSIS — G541 Lumbosacral plexus disorders: Secondary | ICD-10-CM | POA: Diagnosis not present

## 2015-04-23 DIAGNOSIS — R202 Paresthesia of skin: Secondary | ICD-10-CM | POA: Diagnosis not present

## 2015-04-23 DIAGNOSIS — M542 Cervicalgia: Secondary | ICD-10-CM | POA: Diagnosis not present

## 2015-04-24 DIAGNOSIS — E1022 Type 1 diabetes mellitus with diabetic chronic kidney disease: Secondary | ICD-10-CM | POA: Diagnosis not present

## 2015-04-24 DIAGNOSIS — Z9119 Patient's noncompliance with other medical treatment and regimen: Secondary | ICD-10-CM | POA: Diagnosis not present

## 2015-04-24 DIAGNOSIS — E039 Hypothyroidism, unspecified: Secondary | ICD-10-CM | POA: Diagnosis not present

## 2015-04-24 DIAGNOSIS — K868 Other specified diseases of pancreas: Secondary | ICD-10-CM | POA: Diagnosis not present

## 2015-04-24 DIAGNOSIS — I10 Essential (primary) hypertension: Secondary | ICD-10-CM | POA: Diagnosis not present

## 2015-05-17 DIAGNOSIS — E1140 Type 2 diabetes mellitus with diabetic neuropathy, unspecified: Secondary | ICD-10-CM | POA: Diagnosis not present

## 2015-05-17 DIAGNOSIS — E785 Hyperlipidemia, unspecified: Secondary | ICD-10-CM | POA: Diagnosis not present

## 2015-05-17 DIAGNOSIS — J449 Chronic obstructive pulmonary disease, unspecified: Secondary | ICD-10-CM | POA: Diagnosis not present

## 2015-05-23 DIAGNOSIS — G8929 Other chronic pain: Secondary | ICD-10-CM | POA: Diagnosis not present

## 2015-05-23 DIAGNOSIS — M545 Low back pain: Secondary | ICD-10-CM | POA: Diagnosis not present

## 2015-06-12 DIAGNOSIS — M545 Low back pain: Secondary | ICD-10-CM | POA: Diagnosis not present

## 2015-06-12 DIAGNOSIS — G8929 Other chronic pain: Secondary | ICD-10-CM | POA: Diagnosis not present

## 2015-06-17 DIAGNOSIS — J449 Chronic obstructive pulmonary disease, unspecified: Secondary | ICD-10-CM | POA: Diagnosis not present

## 2015-06-17 DIAGNOSIS — I10 Essential (primary) hypertension: Secondary | ICD-10-CM | POA: Diagnosis not present

## 2015-07-21 ENCOUNTER — Other Ambulatory Visit: Payer: Self-pay

## 2015-07-21 DIAGNOSIS — M545 Low back pain: Secondary | ICD-10-CM | POA: Diagnosis not present

## 2015-07-21 DIAGNOSIS — M542 Cervicalgia: Secondary | ICD-10-CM | POA: Diagnosis not present

## 2015-07-21 DIAGNOSIS — G890 Central pain syndrome: Secondary | ICD-10-CM | POA: Diagnosis not present

## 2015-07-21 DIAGNOSIS — R202 Paresthesia of skin: Secondary | ICD-10-CM | POA: Diagnosis not present

## 2015-07-21 MED ORDER — ERGOCALCIFEROL 1.25 MG (50000 UT) PO CAPS: 50000.0000 [IU] | ORAL_CAPSULE | ORAL | Status: DC

## 2015-07-24 ENCOUNTER — Other Ambulatory Visit: Payer: Self-pay | Admitting: "Endocrinology

## 2015-07-24 DIAGNOSIS — E039 Hypothyroidism, unspecified: Secondary | ICD-10-CM

## 2015-07-24 DIAGNOSIS — IMO0002 Reserved for concepts with insufficient information to code with codable children: Secondary | ICD-10-CM

## 2015-07-24 DIAGNOSIS — E1165 Type 2 diabetes mellitus with hyperglycemia: Secondary | ICD-10-CM | POA: Diagnosis not present

## 2015-07-24 DIAGNOSIS — E1169 Type 2 diabetes mellitus with other specified complication: Principal | ICD-10-CM

## 2015-07-24 LAB — COMPREHENSIVE METABOLIC PANEL
ALK PHOS: 262 U/L — AB (ref 40–115)
ALT: 13 U/L (ref 9–46)
AST: 19 U/L (ref 10–35)
Albumin: 3.1 g/dL — ABNORMAL LOW (ref 3.6–5.1)
BUN: 18 mg/dL (ref 7–25)
CALCIUM: 8.1 mg/dL — AB (ref 8.6–10.3)
CO2: 19 mmol/L — ABNORMAL LOW (ref 20–31)
Chloride: 111 mmol/L — ABNORMAL HIGH (ref 98–110)
Creat: 1.44 mg/dL — ABNORMAL HIGH (ref 0.70–1.33)
GLUCOSE: 300 mg/dL — AB (ref 65–99)
POTASSIUM: 4.6 mmol/L (ref 3.5–5.3)
Sodium: 139 mmol/L (ref 135–146)
Total Bilirubin: 0.3 mg/dL (ref 0.2–1.2)
Total Protein: 5.8 g/dL — ABNORMAL LOW (ref 6.1–8.1)

## 2015-07-25 LAB — HEMOGLOBIN A1C
Hgb A1c MFr Bld: 13.3 % — ABNORMAL HIGH (ref <5.7)
Mean Plasma Glucose: 335 mg/dL — ABNORMAL HIGH (ref <117)

## 2015-07-25 LAB — T4, FREE: FREE T4: 0.63 ng/dL — AB (ref 0.80–1.80)

## 2015-07-25 LAB — TSH: TSH: 0.648 u[IU]/mL (ref 0.350–4.500)

## 2015-07-29 DIAGNOSIS — J449 Chronic obstructive pulmonary disease, unspecified: Secondary | ICD-10-CM | POA: Diagnosis not present

## 2015-07-29 DIAGNOSIS — E1142 Type 2 diabetes mellitus with diabetic polyneuropathy: Secondary | ICD-10-CM | POA: Diagnosis not present

## 2015-07-29 DIAGNOSIS — F17200 Nicotine dependence, unspecified, uncomplicated: Secondary | ICD-10-CM | POA: Diagnosis not present

## 2015-07-29 DIAGNOSIS — I10 Essential (primary) hypertension: Secondary | ICD-10-CM | POA: Diagnosis not present

## 2015-07-30 ENCOUNTER — Other Ambulatory Visit (HOSPITAL_COMMUNITY): Payer: Self-pay | Admitting: Internal Medicine

## 2015-07-30 ENCOUNTER — Ambulatory Visit (HOSPITAL_COMMUNITY)
Admission: RE | Admit: 2015-07-30 | Discharge: 2015-07-30 | Disposition: A | Discharge status: Home / Self Care | Payer: Medicare Other | Source: Ambulatory Visit | Attending: Internal Medicine | Admitting: Internal Medicine

## 2015-07-30 ENCOUNTER — Ambulatory Visit (INDEPENDENT_AMBULATORY_CARE_PROVIDER_SITE_OTHER): Payer: Medicare Other | Admitting: "Endocrinology

## 2015-07-30 ENCOUNTER — Encounter: Payer: Self-pay | Admitting: "Endocrinology

## 2015-07-30 VITALS — BP 148/90 | HR 81 | Ht 66.0 in | Wt 119.0 lb

## 2015-07-30 DIAGNOSIS — E785 Hyperlipidemia, unspecified: Secondary | ICD-10-CM

## 2015-07-30 DIAGNOSIS — K8689 Other specified diseases of pancreas: Secondary | ICD-10-CM | POA: Diagnosis not present

## 2015-07-30 DIAGNOSIS — E1065 Type 1 diabetes mellitus with hyperglycemia: Secondary | ICD-10-CM

## 2015-07-30 DIAGNOSIS — I10 Essential (primary) hypertension: Secondary | ICD-10-CM

## 2015-07-30 DIAGNOSIS — R05 Cough: Secondary | ICD-10-CM | POA: Diagnosis not present

## 2015-07-30 DIAGNOSIS — F17200 Nicotine dependence, unspecified, uncomplicated: Secondary | ICD-10-CM | POA: Diagnosis not present

## 2015-07-30 DIAGNOSIS — E108 Type 1 diabetes mellitus with unspecified complications: Secondary | ICD-10-CM | POA: Diagnosis not present

## 2015-07-30 DIAGNOSIS — R0602 Shortness of breath: Secondary | ICD-10-CM

## 2015-07-30 DIAGNOSIS — E039 Hypothyroidism, unspecified: Secondary | ICD-10-CM

## 2015-07-30 DIAGNOSIS — IMO0002 Reserved for concepts with insufficient information to code with codable children: Secondary | ICD-10-CM

## 2015-07-30 MED ORDER — GLUCOSE BLOOD VI STRP: ORAL_STRIP | Status: DC

## 2015-07-30 MED ORDER — LEVOTHYROXINE SODIUM 75 MCG PO TABS: 75.0000 ug | ORAL_TABLET | Freq: Every day | ORAL | Status: DC

## 2015-07-30 NOTE — Progress Notes (Signed)
Subjective:    Patient ID: [name] [name], male    DOB: 1966/07/28,    Past Medical History  Diagnosis Date  . Diabetes mellitus   . Pancreatitis chronic   . Hypertension   . Hypercholesteremia   . GERD (gastroesophageal reflux disease)   . Anxiety   . Neuropathy [facility])    Past Surgical History  Procedure Laterality Date  . Cholecystectomy    . Colon surgery      removal of colon from blockage-took half of colon out.  . Cataract extraction w/phaco Left 12/10/2013    Procedure: CATARACT EXTRACTION PHACO AND INTRAOCULAR LENS PLACEMENT (IOC);  Surgeon: Tonny Branch, MD;  Location: [facility];  Service: Ophthalmology;  Laterality: Left;  CDE: 9.26  . Cataract extraction w/phaco Right 11/26/2013    Procedure: CATARACT EXTRACTION PHACO AND INTRAOCULAR LENS PLACEMENT (IOC);  Surgeon: Tonny Branch, MD;  Location: [facility];  Service: Ophthalmology;  Laterality: Right;  CDE:14.53   Social History   Social History  . Marital Status: Married    Spouse Name: N/A  . Number of Children: N/A  . Years of Education: N/A   Social History Main Topics  . Smoking status: Current Every Day Smoker -- 1.00 packs/day for 30 years    Types: Cigarettes  . Smokeless tobacco: None  . Alcohol Use: No  . Drug Use: No  . Sexual Activity: Yes    Birth Control/ Protection: None   Other Topics Concern  . None   Social History Narrative   Outpatient Encounter Prescriptions as of 07/30/2015  Medication Sig  . albuterol-ipratropium (COMBIVENT) 18-103 MCG/ACT inhaler Inhale into the lungs every 6 (six) hours as needed for wheezing or shortness of breath.  Marland Kitchen buPROPion (WELLBUTRIN XL) 300 MG 24 hr tablet Take 300 mg by mouth daily.  . cilostazol (PLETAL) 50 MG tablet Take 50 mg by mouth 2 (two) times daily.  Marland Kitchen gabapentin (NEURONTIN) 300 MG capsule Take 600 mg by mouth 3 (three) times daily.  . insulin aspart (NOVOLOG) 100 UNIT/ML injection Inject 6 Units into the skin 3 (three) times daily with meals.  .  Insulin Glargine (TOUJEO SOLOSTAR) 300 UNIT/ML SOPN Inject 26 Units into the skin at bedtime.  Marland Kitchen levothyroxine (SYNTHROID, LEVOTHROID) 75 MCG tablet Take 1 tablet (75 mcg total) by mouth daily before breakfast.  . lipase/protease/amylase (CREON) 12000 UNITS CPEP capsule Take 12,000 Units by mouth 3 (three) times daily before meals.  . metoCLOPramide (REGLAN) 5 MG tablet Take 5 mg by mouth 4 (four) times daily.  Marland Kitchen omeprazole (PRILOSEC) 20 MG capsule Take 20 mg by mouth daily.  . ramipril (ALTACE) 5 MG capsule Take 5 mg by mouth daily.  . simvastatin (ZOCOR) 40 MG tablet Take 40 mg by mouth every evening.  Marland Kitchen tiZANidine (ZANAFLEX) 2 MG tablet Take 2-8 mg by mouth 2 (two) times daily. **Take one tablet by mouth in the morning and two tablets in the evening**  . [DISCONTINUED] levothyroxine (SYNTHROID, LEVOTHROID) 50 MCG tablet Take 50 mcg by mouth daily before breakfast.  . Aspirin-Salicylamide-Caffeine (BC HEADACHE POWDER PO) Take 1 packet by mouth 4 (four) times daily.  . ergocalciferol (VITAMIN D2) 50000 UNITS capsule Take 1 capsule (50,000 Units total) by mouth once a week.  . fish oil-omega-3 fatty acids 1000 MG capsule Take 1 g by mouth 2 (two) times daily.  Marland Kitchen gemfibrozil (LOPID) 600 MG tablet Take 600 mg by mouth 2 (two) times daily.  Marland Kitchen glucose blood (ONE TOUCH TEST STRIPS) test  strip Use as instructed  . hydrOXYzine (VISTARIL) 25 MG capsule Take 25 mg by mouth 2 (two) times daily.  Marland Kitchen oxyCODONE (ROXICODONE) 15 MG immediate release tablet Take 15 mg by mouth every 4 (four) hours as needed. For pain  . ursodiol (ACTIGALL) 500 MG tablet Take 500 mg by mouth 2 (two) times daily.  . [DISCONTINUED] insulin glargine (LANTUS) 100 UNIT/ML injection Inject 25-35 Units into the skin 2 (two) times daily.  . [DISCONTINUED] Insulin Glargine (TOUJEO SOLOSTAR) 300 UNIT/ML SOPN Inject 26 Units into the skin.  . [DISCONTINUED] Pancrelipase, Lip-Prot-Amyl, (CREON) 24000 UNITS CPEP Take 1 capsule by mouth 3  (three) times daily.  . [DISCONTINUED] sitaGLIPtin (JANUVIA) 100 MG tablet Take 100 mg by mouth daily.   No facility-administered encounter medications on file as of 07/30/2015.   ALLERGIES: No Known Allergies VACCINATION STATUS:  There is no immunization history on file for this patient.  Diabetes He presents for his follow-up diabetic visit. He has type 1 diabetes mellitus. 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 His disease course has been worsening. There are no hypoglycemic associated symptoms. Pertinent negatives for hypoglycemia include no confusion, headaches, pallor or seizures. Associated symptoms include polydipsia and polyuria. Pertinent negatives for diabetes include no chest pain, no fatigue, no polyphagia and no weakness. There are no hypoglycemic complications. Symptoms are worsening. Diabetic complications include autonomic neuropathy. (He has concurrent pancreatic insufficiency with malabsorption for which he is on Creon with meals and snacks. He is not compliant to this medication. His weight has been fluctuating. ) Risk factors for coronary artery disease include diabetes mellitus, male sex, sedentary lifestyle and tobacco exposure. He is compliant with treatment some of the time. He is following a generally unhealthy diet. He has had a previous visit with a dietitian. His overall blood glucose range is >200 mg/dl. An ACE inhibitor/angiotensin II receptor blocker is being taken.  Thyroid Problem Presents for follow-up visit. Patient reports no constipation, diarrhea, fatigue or palpitations. The symptoms have been stable. Past treatments include levothyroxine. His past medical history is significant for hyperlipidemia.  Hyperlipidemia This is a chronic problem. The current episode started more than 1 year ago. Pertinent negatives include no chest pain, myalgias or  shortness of breath. Current antihyperlipidemic treatment includes statins.     Review of Systems  Constitutional: Negative for fatigue and unexpected weight change.  HENT: Negative for dental problem, mouth sores and trouble swallowing.   Eyes: Negative for visual disturbance.  Respiratory: Negative for cough, choking, chest tightness, shortness of breath and wheezing.   Cardiovascular: Negative for chest pain, palpitations and leg swelling.  Gastrointestinal: Negative for nausea, vomiting, abdominal pain, diarrhea, constipation and abdominal distention.  Endocrine: Positive for polydipsia and polyuria. Negative for polyphagia.  Genitourinary: Negative for dysuria, urgency, hematuria and flank pain.  Musculoskeletal: Negative for myalgias, back pain, gait problem and neck pain.  Skin: Negative for pallor, rash and wound.  Neurological: Negative for seizures, syncope, weakness, numbness and headaches.  Psychiatric/Behavioral: Negative.  Negative for confusion and dysphoric mood.    Objective:    BP 148/90 mmHg  Pulse 81  Ht 5\' 6"  (1.676 m)  Wt 119 lb (53.978 kg)  BMI 19.22 kg/m2  SpO2 99%  Wt Readings from Last 3 Encounters:  07/30/15 119 lb (53.978 kg)  08/26/14 137 lb 12.8 oz (62.506 kg)  08/26/14 137 lb (62.143 kg)    Physical  Exam  Constitutional: He is oriented to person, place, and time. He appears well-developed. He is cooperative. No distress.  HENT:  Head: Normocephalic and atraumatic.  Eyes: EOM are normal.  Neck: Normal range of motion. Neck supple. No tracheal deviation present. No thyromegaly present.  Cardiovascular: Normal rate, S1 normal, S2 normal and normal heart sounds.  Exam reveals no gallop.   No murmur heard. Pulses:      Dorsalis pedis pulses are 1+ on the right side, and 1+ on the left side.       Posterior tibial pulses are 1+ on the right side, and 1+ on the left side.  Pulmonary/Chest: No respiratory distress. He has no wheezes.  Abdominal:  Soft. Bowel sounds are normal. He exhibits no distension. There is no tenderness. There is no guarding and no CVA tenderness.  Musculoskeletal: He exhibits no edema.       Right shoulder: He exhibits no swelling and no deformity.  Neurological: He is alert and oriented to person, place, and time. He has normal strength and normal reflexes. No cranial nerve deficit or sensory deficit. Gait normal.  Skin: Skin is warm and dry. No rash noted. No cyanosis. Nails show no clubbing.  Psychiatric: He has a normal mood and affect. His speech is normal and behavior is normal. Judgment and thought content normal. Cognition and memory are normal.    Results for orders placed or performed in visit on 07/24/15  Comprehensive metabolic panel  Result Value Ref Range   Sodium 139 135 - 146 mmol/L   Potassium 4.6 3.5 - 5.3 mmol/L   Chloride 111 (H) 98 - 110 mmol/L   CO2 19 (L) 20 - 31 mmol/L   Glucose, Bld 300 (H) 65 - 99 mg/dL   BUN 18 7 - 25 mg/dL   Creat 1.44 (H) 0.70 - 1.33 mg/dL   Total Bilirubin 0.3 0.2 - 1.2 mg/dL   Alkaline Phosphatase 262 (H) 40 - 115 U/L   AST 19 10 - 35 U/L   ALT 13 9 - 46 U/L   Total Protein 5.8 (L) 6.1 - 8.1 g/dL   Albumin 3.1 (L) 3.6 - 5.1 g/dL   Calcium 8.1 (L) 8.6 - 10.3 mg/dL  Hemoglobin A1c  Result Value Ref Range   Hgb A1c MFr Bld 13.3 (H) <5.7 %   Mean Plasma Glucose 335 (H) <117 mg/dL  TSH  Result Value Ref Range   TSH 0.648 0.350 - 4.500 uIU/mL  T4, Free  Result Value Ref Range   Free T4 0.63 (L) 0.80 - 1.80 ng/dL   Complete Blood Count (Most recent): Lab Results  Component Value Date   WBC 8.5 12/07/2011   HGB 12.2* 11/21/2013   HCT 38.1* 11/21/2013   MCV 80.8 12/07/2011   PLT 200 12/07/2011   Chemistry (most recent): Lab Results  Component Value Date   NA 139 07/24/2015   K 4.6 07/24/2015   CL 111* 07/24/2015   CO2 19* 07/24/2015   BUN 18 07/24/2015   CREATININE 1.44* 07/24/2015   Diabetic Labs (most recent): Lab Results  Component Value  Date   HGBA1C 13.3* 07/24/2015   HGBA1C * 05/13/2010    12.6 (NOTE)  According to the ADA Clinical Practice Recommendations for 2011, when HbA1c is used as a screening test:   >=6.5%   Diagnostic of Diabetes Mellitus           (if abnormal result  is confirmed)  5.7-6.4%   Increased risk of developing Diabetes Mellitus  References:Diagnosis and Classification of Diabetes Mellitus,Diabetes AVWU,9811,91(YNWGN 1):S62-S69 and Standards of Medical Care in         Diabetes - 2011,Diabetes FAOZ,3086,57  (Suppl 1):S11-S61.   HGBA1C * 05/13/2010    11.8 (NOTE)                                                                       According to the ADA Clinical Practice Recommendations for 2011, when HbA1c is used as a screening test:   >=6.5%   Diagnostic of Diabetes Mellitus           (if abnormal result  is confirmed)  5.7-6.4%   Increased risk of developing Diabetes Mellitus  References:Diagnosis and Classification of Diabetes Mellitus,Diabetes QION,6295,28(UXLKG 1):S62-S69 and Standards of Medical Care in         Diabetes - 2011,Diabetes MWNU,2725,36  (Suppl 1):S11-S61.   Lipid profile (most recent): Lab Results  Component Value Date   TRIG 325* 05/13/2010   CHOL  05/13/2010    183        ATP III CLASSIFICATION:  <200     mg/dL   Desirable  200-239  mg/dL   Borderline High  >=240    mg/dL   High                Assessment & Plan:   1. Uncontrolled type 1 diabetes mellitus with complication (HCC)   His diabetes is  complicated by neuropathy and patient remains at a high risk for more acute and chronic complications of diabetes which include CAD, CVA, CKD, retinopathy, and neuropathy. These are all discussed in detail with the patient.  Patient came with worsening glucose profile, and  recent A1c of 13.3 %.  Glucose logs and insulin administration records pertaining to this visit,  to be scanned into patient's records.   Recent labs reviewed.    - Patient is advised to stick to a routine mealtimes to eat 3 meals  a day with 2-3 snacks to avoid hypoglycemia. - The patient  has been  scheduled with Jearld Fenton, RDN, CDE for individualized DM education.  - I have approached patient with the following individualized plan to manage diabetes and patient agrees.  - I urged him to resume Toujeo 26 units qhs, and Novolog 6 units TIDAC plus SSI, associated with monitoring of BG AC and HS.    -Adjustment parameters for hypo and hyperglycemia were given in a written document to patient. -Patient is encouraged to call clinic for blood glucose levels less than 70 or above 300 mg /dl. He is urged to come in 12 weeks with labs, meter, and logs for reevaluation. -I have encouraged him to use creon given pancreatic insufficiency. -Patient is encouraged to continue to follow up with Ophthalmology at least yearly. -He is not suitable candidate for incretin therapy.  - Patient specific target  for A1c; LDL, HDL, Triglycerides, and  Waist Circumference were discussed in detail.  2) BP/HTN: Controlled. Continue current medications including ACEI/ARB. 3) Lipids/HPL:  continue simvastatin 40 mg by mouth daily at bedtime.  4) Pancreatic insufficiency ([place]): This is due to prior pancreatitis secondary to alcohol abuse. I have advised him to continue Creon with meals and snacks.  5) Primary hypothyroidism: -Increase  Lt4 5 to 75 mcg po qam.  - We discussed about correct intake of levothyroxine, at fasting, with water, separated by at least 30 minutes from breakfast, and separated by more than 4 hours from calcium, iron, multivitamins, acid reflux medications (PPIs). -Patient is made aware of the fact that thyroid hormone replacement is needed for life, dose to be adjusted by periodic monitoring of thyroid function tests.  5) Chronic Care/Health Maintenance:  -Patient  on ACEI/ARB and Statin medications and encouraged to  continue to follow up with Ophthalmology, Podiatrist at least yearly or according to recommendations, and advised to quit  smoking. I have recommended yearly flu vaccine and pneumonia vaccination at least every 5 years;  and  sleep for at least 7 hours a day.  I advised patient to maintain close follow up with their PCP for primary care needs.  Patient is asked to bring meter and  blood glucose logs during their next visit.   Follow up plan: Return in about 3 months (around 10/30/2015) for diabetes, high blood pressure, underactive thyroid, follow up with pre-visit labs, meter, and logs.  Glade Lloyd, MD Phone: [phone]  Fax: [phone]   07/30/2015, 3:08 PM

## 2015-08-18 DIAGNOSIS — Z79891 Long term (current) use of opiate analgesic: Secondary | ICD-10-CM | POA: Diagnosis not present

## 2015-08-18 DIAGNOSIS — G894 Chronic pain syndrome: Secondary | ICD-10-CM | POA: Diagnosis not present

## 2015-08-18 DIAGNOSIS — G890 Central pain syndrome: Secondary | ICD-10-CM | POA: Diagnosis not present

## 2015-08-18 DIAGNOSIS — M545 Low back pain: Secondary | ICD-10-CM | POA: Diagnosis not present

## 2015-08-18 DIAGNOSIS — M5441 Lumbago with sciatica, right side: Secondary | ICD-10-CM | POA: Diagnosis not present

## 2015-08-18 DIAGNOSIS — M5432 Sciatica, left side: Secondary | ICD-10-CM | POA: Diagnosis not present

## 2015-09-15 DIAGNOSIS — M545 Low back pain: Secondary | ICD-10-CM | POA: Diagnosis not present

## 2015-09-15 DIAGNOSIS — G894 Chronic pain syndrome: Secondary | ICD-10-CM | POA: Diagnosis not present

## 2015-09-15 DIAGNOSIS — Z79891 Long term (current) use of opiate analgesic: Secondary | ICD-10-CM | POA: Diagnosis not present

## 2015-09-29 DIAGNOSIS — J449 Chronic obstructive pulmonary disease, unspecified: Secondary | ICD-10-CM | POA: Diagnosis not present

## 2015-09-29 DIAGNOSIS — E1142 Type 2 diabetes mellitus with diabetic polyneuropathy: Secondary | ICD-10-CM | POA: Diagnosis not present

## 2015-10-15 DIAGNOSIS — K861 Other chronic pancreatitis: Secondary | ICD-10-CM | POA: Diagnosis not present

## 2015-10-15 DIAGNOSIS — G894 Chronic pain syndrome: Secondary | ICD-10-CM | POA: Diagnosis not present

## 2015-10-15 DIAGNOSIS — M545 Low back pain: Secondary | ICD-10-CM | POA: Diagnosis not present

## 2015-10-15 DIAGNOSIS — R1011 Right upper quadrant pain: Secondary | ICD-10-CM | POA: Diagnosis not present

## 2015-10-15 DIAGNOSIS — Z79891 Long term (current) use of opiate analgesic: Secondary | ICD-10-CM | POA: Diagnosis not present

## 2015-10-20 ENCOUNTER — Other Ambulatory Visit: Payer: Self-pay | Admitting: "Endocrinology

## 2015-10-30 DIAGNOSIS — J449 Chronic obstructive pulmonary disease, unspecified: Secondary | ICD-10-CM | POA: Diagnosis not present

## 2015-10-30 DIAGNOSIS — E1142 Type 2 diabetes mellitus with diabetic polyneuropathy: Secondary | ICD-10-CM | POA: Diagnosis not present

## 2015-11-03 ENCOUNTER — Ambulatory Visit: Payer: Medicare Other | Admitting: "Endocrinology

## 2015-11-12 DIAGNOSIS — Z79891 Long term (current) use of opiate analgesic: Secondary | ICD-10-CM | POA: Diagnosis not present

## 2015-11-12 DIAGNOSIS — R1011 Right upper quadrant pain: Secondary | ICD-10-CM | POA: Diagnosis not present

## 2015-11-12 DIAGNOSIS — G894 Chronic pain syndrome: Secondary | ICD-10-CM | POA: Diagnosis not present

## 2015-11-12 DIAGNOSIS — M545 Low back pain: Secondary | ICD-10-CM | POA: Diagnosis not present

## 2015-11-12 DIAGNOSIS — K861 Other chronic pancreatitis: Secondary | ICD-10-CM | POA: Diagnosis not present

## 2015-11-30 DIAGNOSIS — I10 Essential (primary) hypertension: Secondary | ICD-10-CM | POA: Diagnosis not present

## 2015-11-30 DIAGNOSIS — E1142 Type 2 diabetes mellitus with diabetic polyneuropathy: Secondary | ICD-10-CM | POA: Diagnosis not present

## 2015-12-10 DIAGNOSIS — G894 Chronic pain syndrome: Secondary | ICD-10-CM | POA: Diagnosis not present

## 2015-12-10 DIAGNOSIS — R1011 Right upper quadrant pain: Secondary | ICD-10-CM | POA: Diagnosis not present

## 2015-12-10 DIAGNOSIS — M545 Low back pain: Secondary | ICD-10-CM | POA: Diagnosis not present

## 2015-12-10 DIAGNOSIS — K861 Other chronic pancreatitis: Secondary | ICD-10-CM | POA: Diagnosis not present

## 2015-12-10 DIAGNOSIS — Z79891 Long term (current) use of opiate analgesic: Secondary | ICD-10-CM | POA: Diagnosis not present

## 2015-12-28 DIAGNOSIS — I10 Essential (primary) hypertension: Secondary | ICD-10-CM | POA: Diagnosis not present

## 2015-12-28 DIAGNOSIS — E1142 Type 2 diabetes mellitus with diabetic polyneuropathy: Secondary | ICD-10-CM | POA: Diagnosis not present

## 2016-01-14 DIAGNOSIS — R1011 Right upper quadrant pain: Secondary | ICD-10-CM | POA: Diagnosis not present

## 2016-01-14 DIAGNOSIS — Z79891 Long term (current) use of opiate analgesic: Secondary | ICD-10-CM | POA: Diagnosis not present

## 2016-01-14 DIAGNOSIS — G603 Idiopathic progressive neuropathy: Secondary | ICD-10-CM | POA: Diagnosis not present

## 2016-01-14 DIAGNOSIS — G890 Central pain syndrome: Secondary | ICD-10-CM | POA: Diagnosis not present

## 2016-01-14 DIAGNOSIS — M545 Low back pain: Secondary | ICD-10-CM | POA: Diagnosis not present

## 2016-01-14 DIAGNOSIS — K861 Other chronic pancreatitis: Secondary | ICD-10-CM | POA: Diagnosis not present

## 2016-01-14 DIAGNOSIS — G894 Chronic pain syndrome: Secondary | ICD-10-CM | POA: Diagnosis not present

## 2016-01-14 DIAGNOSIS — G541 Lumbosacral plexus disorders: Secondary | ICD-10-CM | POA: Diagnosis not present

## 2016-01-15 ENCOUNTER — Other Ambulatory Visit: Payer: Self-pay | Admitting: "Endocrinology

## 2016-02-11 DIAGNOSIS — K861 Other chronic pancreatitis: Secondary | ICD-10-CM | POA: Diagnosis not present

## 2016-02-11 DIAGNOSIS — G894 Chronic pain syndrome: Secondary | ICD-10-CM | POA: Diagnosis not present

## 2016-02-11 DIAGNOSIS — R1011 Right upper quadrant pain: Secondary | ICD-10-CM | POA: Diagnosis not present

## 2016-02-11 DIAGNOSIS — Z79891 Long term (current) use of opiate analgesic: Secondary | ICD-10-CM | POA: Diagnosis not present

## 2016-02-11 DIAGNOSIS — M545 Low back pain: Secondary | ICD-10-CM | POA: Diagnosis not present

## 2016-02-12 DIAGNOSIS — I10 Essential (primary) hypertension: Secondary | ICD-10-CM | POA: Diagnosis not present

## 2016-02-12 DIAGNOSIS — F17200 Nicotine dependence, unspecified, uncomplicated: Secondary | ICD-10-CM | POA: Diagnosis not present

## 2016-02-12 DIAGNOSIS — R634 Abnormal weight loss: Secondary | ICD-10-CM | POA: Diagnosis not present

## 2016-02-12 DIAGNOSIS — Z0000 Encounter for general adult medical examination without abnormal findings: Secondary | ICD-10-CM | POA: Diagnosis not present

## 2016-02-12 DIAGNOSIS — J449 Chronic obstructive pulmonary disease, unspecified: Secondary | ICD-10-CM | POA: Diagnosis not present

## 2016-02-12 DIAGNOSIS — E0842 Diabetes mellitus due to underlying condition with diabetic polyneuropathy: Secondary | ICD-10-CM | POA: Diagnosis not present

## 2016-02-12 DIAGNOSIS — E1165 Type 2 diabetes mellitus with hyperglycemia: Secondary | ICD-10-CM | POA: Diagnosis not present

## 2016-02-12 DIAGNOSIS — E1140 Type 2 diabetes mellitus with diabetic neuropathy, unspecified: Secondary | ICD-10-CM | POA: Diagnosis not present

## 2016-02-12 DIAGNOSIS — F339 Major depressive disorder, recurrent, unspecified: Secondary | ICD-10-CM | POA: Diagnosis not present

## 2016-02-13 ENCOUNTER — Other Ambulatory Visit: Payer: Self-pay | Admitting: "Endocrinology

## 2016-02-17 ENCOUNTER — Other Ambulatory Visit: Payer: Self-pay | Admitting: "Endocrinology

## 2016-02-19 ENCOUNTER — Other Ambulatory Visit: Payer: Self-pay | Admitting: "Endocrinology

## 2016-03-05 ENCOUNTER — Other Ambulatory Visit: Payer: Self-pay | Admitting: "Endocrinology

## 2016-03-10 DIAGNOSIS — M545 Low back pain: Secondary | ICD-10-CM | POA: Diagnosis not present

## 2016-03-10 DIAGNOSIS — Z79891 Long term (current) use of opiate analgesic: Secondary | ICD-10-CM | POA: Diagnosis not present

## 2016-03-10 DIAGNOSIS — R1011 Right upper quadrant pain: Secondary | ICD-10-CM | POA: Diagnosis not present

## 2016-03-10 DIAGNOSIS — G894 Chronic pain syndrome: Secondary | ICD-10-CM | POA: Diagnosis not present

## 2016-03-10 DIAGNOSIS — K861 Other chronic pancreatitis: Secondary | ICD-10-CM | POA: Diagnosis not present

## 2016-03-14 DIAGNOSIS — E1140 Type 2 diabetes mellitus with diabetic neuropathy, unspecified: Secondary | ICD-10-CM | POA: Diagnosis not present

## 2016-03-14 DIAGNOSIS — I10 Essential (primary) hypertension: Secondary | ICD-10-CM | POA: Diagnosis not present

## 2016-04-08 DIAGNOSIS — M545 Low back pain: Secondary | ICD-10-CM | POA: Diagnosis not present

## 2016-04-08 DIAGNOSIS — Z79891 Long term (current) use of opiate analgesic: Secondary | ICD-10-CM | POA: Diagnosis not present

## 2016-04-08 DIAGNOSIS — G541 Lumbosacral plexus disorders: Secondary | ICD-10-CM | POA: Diagnosis not present

## 2016-04-08 DIAGNOSIS — G894 Chronic pain syndrome: Secondary | ICD-10-CM | POA: Diagnosis not present

## 2016-04-10 ENCOUNTER — Emergency Department (HOSPITAL_COMMUNITY)
Admission: EM | Admit: 2016-04-10 | Discharge: 2016-04-10 | Disposition: A | Discharge status: Home / Self Care | Payer: Medicare Other | Attending: Emergency Medicine | Admitting: Emergency Medicine

## 2016-04-10 ENCOUNTER — Encounter (HOSPITAL_COMMUNITY): Payer: Self-pay | Admitting: Emergency Medicine

## 2016-04-10 ENCOUNTER — Emergency Department (HOSPITAL_COMMUNITY): Payer: Medicare Other

## 2016-04-10 DIAGNOSIS — E860 Dehydration: Secondary | ICD-10-CM | POA: Insufficient documentation

## 2016-04-10 DIAGNOSIS — Y999 Unspecified external cause status: Secondary | ICD-10-CM | POA: Insufficient documentation

## 2016-04-10 DIAGNOSIS — R531 Weakness: Secondary | ICD-10-CM | POA: Diagnosis not present

## 2016-04-10 DIAGNOSIS — Y939 Activity, unspecified: Secondary | ICD-10-CM | POA: Insufficient documentation

## 2016-04-10 DIAGNOSIS — F17210 Nicotine dependence, cigarettes, uncomplicated: Secondary | ICD-10-CM | POA: Insufficient documentation

## 2016-04-10 DIAGNOSIS — Y929 Unspecified place or not applicable: Secondary | ICD-10-CM | POA: Diagnosis not present

## 2016-04-10 DIAGNOSIS — E7800 Pure hypercholesterolemia, unspecified: Secondary | ICD-10-CM | POA: Diagnosis not present

## 2016-04-10 DIAGNOSIS — M7989 Other specified soft tissue disorders: Secondary | ICD-10-CM | POA: Diagnosis not present

## 2016-04-10 DIAGNOSIS — I10 Essential (primary) hypertension: Secondary | ICD-10-CM | POA: Insufficient documentation

## 2016-04-10 DIAGNOSIS — N289 Disorder of kidney and ureter, unspecified: Secondary | ICD-10-CM | POA: Insufficient documentation

## 2016-04-10 DIAGNOSIS — W19XXXA Unspecified fall, initial encounter: Secondary | ICD-10-CM | POA: Insufficient documentation

## 2016-04-10 DIAGNOSIS — S8392XA Sprain of unspecified site of left knee, initial encounter: Secondary | ICD-10-CM | POA: Insufficient documentation

## 2016-04-10 DIAGNOSIS — S8991XA Unspecified injury of right lower leg, initial encounter: Secondary | ICD-10-CM | POA: Diagnosis present

## 2016-04-10 DIAGNOSIS — E1165 Type 2 diabetes mellitus with hyperglycemia: Secondary | ICD-10-CM | POA: Insufficient documentation

## 2016-04-10 DIAGNOSIS — R739 Hyperglycemia, unspecified: Secondary | ICD-10-CM

## 2016-04-10 DIAGNOSIS — Z794 Long term (current) use of insulin: Secondary | ICD-10-CM | POA: Insufficient documentation

## 2016-04-10 DIAGNOSIS — M25562 Pain in left knee: Secondary | ICD-10-CM | POA: Diagnosis not present

## 2016-04-10 DIAGNOSIS — Z7982 Long term (current) use of aspirin: Secondary | ICD-10-CM | POA: Insufficient documentation

## 2016-04-10 DIAGNOSIS — M25561 Pain in right knee: Secondary | ICD-10-CM | POA: Diagnosis not present

## 2016-04-10 DIAGNOSIS — S8391XA Sprain of unspecified site of right knee, initial encounter: Secondary | ICD-10-CM | POA: Diagnosis not present

## 2016-04-10 DIAGNOSIS — S83402A Sprain of unspecified collateral ligament of left knee, initial encounter: Secondary | ICD-10-CM | POA: Diagnosis not present

## 2016-04-10 DIAGNOSIS — R404 Transient alteration of awareness: Secondary | ICD-10-CM | POA: Diagnosis not present

## 2016-04-10 DIAGNOSIS — Z79899 Other long term (current) drug therapy: Secondary | ICD-10-CM | POA: Insufficient documentation

## 2016-04-10 DIAGNOSIS — S83401A Sprain of unspecified collateral ligament of right knee, initial encounter: Secondary | ICD-10-CM | POA: Diagnosis not present

## 2016-04-10 LAB — URINALYSIS, ROUTINE W REFLEX MICROSCOPIC
Bilirubin Urine: NEGATIVE
KETONES UR: NEGATIVE mg/dL
LEUKOCYTES UA: NEGATIVE
Nitrite: NEGATIVE
PH: 6 (ref 5.0–8.0)
Protein, ur: 100 mg/dL — AB
Specific Gravity, Urine: 1.01 (ref 1.005–1.030)

## 2016-04-10 LAB — CBC WITH DIFFERENTIAL/PLATELET
BASOS ABS: 0 10*3/uL (ref 0.0–0.1)
BASOS PCT: 0 %
Eosinophils Absolute: 0.1 10*3/uL (ref 0.0–0.7)
Eosinophils Relative: 2 %
HEMATOCRIT: 35.3 % — AB (ref 39.0–52.0)
HEMOGLOBIN: 11.4 g/dL — AB (ref 13.0–17.0)
LYMPHS PCT: 24 %
Lymphs Abs: 1.1 10*3/uL (ref 0.7–4.0)
MCH: 27.2 pg (ref 26.0–34.0)
MCHC: 32.3 g/dL (ref 30.0–36.0)
MCV: 84.2 fL (ref 78.0–100.0)
Monocytes Absolute: 0.5 10*3/uL (ref 0.1–1.0)
Monocytes Relative: 11 %
NEUTROS ABS: 3 10*3/uL (ref 1.7–7.7)
NEUTROS PCT: 63 %
Platelets: 162 10*3/uL (ref 150–400)
RBC: 4.19 MIL/uL — AB (ref 4.22–5.81)
RDW: 15.6 % — AB (ref 11.5–15.5)
WBC: 4.8 10*3/uL (ref 4.0–10.5)

## 2016-04-10 LAB — COMPREHENSIVE METABOLIC PANEL
ALBUMIN: 1.7 g/dL — AB (ref 3.5–5.0)
ALT: 28 U/L (ref 17–63)
AST: 22 U/L (ref 15–41)
Alkaline Phosphatase: 277 U/L — ABNORMAL HIGH (ref 38–126)
Anion gap: 5 (ref 5–15)
BILIRUBIN TOTAL: 0.7 mg/dL (ref 0.3–1.2)
BUN: 17 mg/dL (ref 6–20)
CO2: 27 mmol/L (ref 22–32)
CREATININE: 1.48 mg/dL — AB (ref 0.61–1.24)
Calcium: 8.6 mg/dL — ABNORMAL LOW (ref 8.9–10.3)
Chloride: 102 mmol/L (ref 101–111)
GFR calc Af Amer: >60 mL/min (ref >60)
GFR, EST NON AFRICAN AMERICAN: 53 mL/min — AB (ref >60)
GLUCOSE: 704 mg/dL — AB (ref 65–99)
POTASSIUM: 4.9 mmol/L (ref 3.5–5.1)
Sodium: 134 mmol/L — ABNORMAL LOW (ref 135–145)
TOTAL PROTEIN: 5.6 g/dL — AB (ref 6.5–8.1)

## 2016-04-10 LAB — TROPONIN I: TROPONIN I: 0.04 ng/mL — AB (ref <0.03)

## 2016-04-10 LAB — CBG MONITORING, ED
GLUCOSE-CAPILLARY: 533 mg/dL — AB (ref 65–99)
GLUCOSE-CAPILLARY: 464 mg/dL — AB (ref 65–99)
Glucose-Capillary: >600 mg/dL (ref 65–99)

## 2016-04-10 LAB — URINE MICROSCOPIC-ADD ON

## 2016-04-10 MED ORDER — SODIUM CHLORIDE 0.9 % IV BOLUS (SEPSIS)
1000.0000 mL | Freq: Once | INTRAVENOUS | Status: AC
  Administered 2016-04-10: 1000 mL via INTRAVENOUS

## 2016-04-10 MED ORDER — SODIUM CHLORIDE 0.9 % IV BOLUS (SEPSIS): 1000.0000 mL | Freq: Once | INTRAVENOUS | Status: DC

## 2016-04-10 MED ORDER — INSULIN ASPART 100 UNIT/ML ~~LOC~~ SOLN
10.0000 [IU] | Freq: Once | SUBCUTANEOUS | Status: AC
  Administered 2016-04-10: 10 [IU] via SUBCUTANEOUS
  Filled 2016-04-10: qty 1

## 2016-04-10 NOTE — ED Notes (Signed)
Pt alert & oriented x4. Patient given discharge instructions, paperwork & prescription(s). Patient verbalized understanding. Pt left department in wheelchair. Pt left w/ no further questions.

## 2016-04-10 NOTE — ED Provider Notes (Signed)
CSN: [id number]     Arrival date & time 04/10/16  1428 History   First MD Initiated Contact with Patient 04/10/16 1432     Chief Complaint  Patient presents with  . Hyperglycemia  . Knee Pain     Patient is a 50 y.o. male presenting with hyperglycemia and knee pain. The history is provided by the patient and the spouse.  Hyperglycemia Severity:  Severe Onset quality:  Gradual Timing:  Constant Progression:  Worsening Chronicity:  Chronic Relieved by:  Nothing Associated symptoms: dehydration, dizziness, fatigue, increased thirst, polyuria and weakness   Associated symptoms: no abdominal pain, no chest pain, no increased appetite, no shortness of breath, no syncope and no vomiting   Knee Pain Associated symptoms: fatigue   Associated symptoms: no back pain and no neck pain   Patient with h/o diabetes He reports for past 4 days he has had fatigue, "no energy" Polyuria and also lack of appetite He reports he is falling as "my legs give out" Denies syncope No HA No LOC His wife was out of town and he began drinking kool-aid and sweet tea   Past Medical History  Diagnosis Date  . Diabetes mellitus   . Pancreatitis chronic   . Hypertension   . Hypercholesteremia   . GERD (gastroesophageal reflux disease)   . Anxiety   . Neuropathy [facility])    Past Surgical History  Procedure Laterality Date  . Cholecystectomy    . Colon surgery      removal of colon from blockage-took half of colon out.  . Cataract extraction w/phaco Left 12/10/2013    Procedure: CATARACT EXTRACTION PHACO AND INTRAOCULAR LENS PLACEMENT (IOC);  Surgeon: Tonny Branch, MD;  Location: [facility];  Service: Ophthalmology;  Laterality: Left;  CDE: 9.26  . Cataract extraction w/phaco Right 11/26/2013    Procedure: CATARACT EXTRACTION PHACO AND INTRAOCULAR LENS PLACEMENT (IOC);  Surgeon: Tonny Branch, MD;  Location: [facility];  Service: Ophthalmology;  Laterality: Right;  CDE:14.53   Family History  Problem Relation Age of Onset   . Diabetes Mother    Social History  Substance Use Topics  . Smoking status: Current Every Day Smoker -- 1.00 packs/day for 30 years    Types: Cigarettes  . Smokeless tobacco: None  . Alcohol Use: No    Review of Systems  Constitutional: Positive for fatigue.  Respiratory: Negative for shortness of breath.   Cardiovascular: Negative for chest pain and syncope.  Gastrointestinal: Negative for vomiting and abdominal pain.  Endocrine: Positive for polydipsia and polyuria.  Musculoskeletal: Negative for back pain and neck pain.  Neurological: Positive for dizziness and weakness. Negative for syncope.  All other systems reviewed and are negative.     Allergies  Review of patient's allergies indicates no known allergies.  Home Medications   Prior to Admission medications   Medication Sig Start Date End Date Taking? Authorizing Provider  albuterol-ipratropium (COMBIVENT) 18-103 MCG/ACT inhaler Inhale into the lungs every 6 (six) hours as needed for wheezing or shortness of breath.    Historical Provider, MD  Aspirin-Salicylamide-Caffeine (BC HEADACHE POWDER PO) Take 1 packet by mouth 4 (four) times daily.    Historical Provider, MD  buPROPion (WELLBUTRIN XL) 300 MG 24 hr tablet Take 300 mg by mouth daily.    Historical Provider, MD  cilostazol (PLETAL) 50 MG tablet Take 50 mg by mouth 2 (two) times daily.    Historical Provider, MD  fish oil-omega-3 fatty acids 1000 MG capsule Take 1 g by mouth  2 (two) times daily.    Historical Provider, MD  gabapentin (NEURONTIN) 300 MG capsule Take 600 mg by mouth 3 (three) times daily.    Historical Provider, MD  gemfibrozil (LOPID) 600 MG tablet Take 600 mg by mouth 2 (two) times daily.    Historical Provider, MD  glucose blood (ONE TOUCH TEST STRIPS) test strip Use as instructed 07/30/15   Cassandria Anger, MD  hydrOXYzine (VISTARIL) 25 MG capsule Take 25 mg by mouth 2 (two) times daily.    Historical Provider, MD  insulin aspart (NOVOLOG)  100 UNIT/ML injection Inject 6 Units into the skin 3 (three) times daily with meals.    Historical Provider, MD  levothyroxine (SYNTHROID, LEVOTHROID) 75 MCG tablet TAKE 1 TABLET BY MOUTH EVERY MORNING. 03/05/16   Cassandria Anger, MD  lipase/protease/amylase (CREON) 12000 UNITS CPEP capsule Take 12,000 Units by mouth 3 (three) times daily before meals.    Historical Provider, MD  metoCLOPramide (REGLAN) 5 MG tablet Take 5 mg by mouth 4 (four) times daily.    Historical Provider, MD  omeprazole (PRILOSEC) 20 MG capsule Take 20 mg by mouth daily.    Historical Provider, MD  oxyCODONE (ROXICODONE) 15 MG immediate release tablet Take 15 mg by mouth every 4 (four) hours as needed. For pain    Historical Provider, MD  ramipril (ALTACE) 5 MG capsule Take 5 mg by mouth daily.    Historical Provider, MD  simvastatin (ZOCOR) 40 MG tablet Take 40 mg by mouth every evening.    Historical Provider, MD  tiZANidine (ZANAFLEX) 2 MG tablet Take 2-8 mg by mouth 2 (two) times daily. **Take one tablet by mouth in the morning and two tablets in the evening**    Historical Provider, MD  TOUJEO SOLOSTAR 300 UNIT/ML SOPN INJECT 26 UNITS SUBCUTANEOUSLY AT BEDTIME. 03/05/16   Cassandria Anger, MD  ursodiol (ACTIGALL) 500 MG tablet Take 500 mg by mouth 2 (two) times daily.    Historical Provider, MD  Vitamin D, Ergocalciferol, (DRISDOL) 50000 units CAPS capsule TAKE ONE CAPSULE BY MOUTH ONCE WEEKLY. 03/05/16   Cassandria Anger, MD   BP 122/93 mmHg  Pulse 81  Temp(Src) 98.3 F (36.8 C) (Oral)  Resp 12  SpO2 97% Physical Exam CONSTITUTIONAL: Disheveled, no acute distress HEAD: Normocephalic/atraumatic EYES: EOMI/PERRL ENMT: Mucous membranes dry NECK: supple no meningeal signs SPINE/BACK:entire spine nontender CV: S1/S2 noted, no murmurs/rubs/gallops noted LUNGS: Lungs are clear to auscultation bilaterally, no apparent distress ABDOMEN: soft, nontender NEURO: Pt is awake/alert/appropriate, moves all  extremitiesx4.  No facial droop.   EXTREMITIES: pulses normal/equal, full ROM, bilateral tenderness to knees, no deformity.  Scattered abrasions noted.  Pelvis stable.   SKIN: warm, color normal PSYCH: no abnormalities of mood noted, alert and oriented to situation   ED Course  Procedures  CRITICAL CARE Performed by: Sharyon Cable Total critical care time: 35 minutes Critical care time was exclusive of separately billable procedures and treating other patients. Critical care was necessary to treat or prevent imminent or life-threatening deterioration. Critical care was time spent personally by me on the following activities: development of treatment plan with patient and/or surrogate as well as nursing, discussions with consultants, evaluation of patient's response to treatment, examination of patient, obtaining history from patient or surrogate, ordering and performing treatments and interventions, ordering and review of laboratory studies, ordering and review of radiographic studies, pulse oximetry and re-evaluation of patient's condition. PATIENT WITH HYPERGLYCEMIA, GLUCOSE >700 REQUIRING TOTAL OF 4 LITERS NORMAL SALINE AND  INSULIN  4:18 PM Pt stable No signs of DKA He is in no distress I doubt ACS (no cp/sob, only fatigue, no EKG changes, troponin only minimally elevated) Will rehydrate, give insulin and reassess 7:45 PM PT IMPROVED TOTAL OF 4 LITERS NORMAL SALINE (ONE LITER FROM EMS) HE IS IMPROVED GLUCOSE IMPROVED HE WANTS TO GO HOME HE IS TAKING PO HE CAN AMBULATE - SLOW BUT STEADY GAIT WIFE REPORTS THAT HE WILL HAVE EPISODES ONCE A MONTH WHEN HIS "LEGS GIVE OUT" THIS IS SIMILAR TO PRIOR DENIES DIZZINESS OR ABRUPT FOCAL WEAKNESS I OFFERED ADMISSION FOR FURTHER GLUCOSE CONTROL AND MONITORING BUT PT PREFERS TO GO HOME HE WILL CALL PCP IN 2 DAYS DISCUSSED STRICT RETURN PRECAUTIONS   Medications  sodium chloride 0.9 % bolus 1,000 mL (0 mLs Intravenous Stopped 04/10/16 1741)   sodium chloride 0.9 % bolus 1,000 mL (0 mLs Intravenous Stopped 04/10/16 1741)  insulin aspart (novoLOG) injection 10 Units (10 Units Subcutaneous Given 04/10/16 1608)  sodium chloride 0.9 % bolus 1,000 mL (0 mLs Intravenous Stopped 04/10/16 1857)    Labs Review Labs Reviewed  CBC WITH DIFFERENTIAL/PLATELET - Abnormal; Notable for the following:    RBC 4.19 (*)    Hemoglobin 11.4 (*)    HCT 35.3 (*)    RDW 15.6 (*)    All other components within normal limits  COMPREHENSIVE METABOLIC PANEL - Abnormal; Notable for the following:    Sodium 134 (*)    Glucose, Bld 704 (*)    Creatinine, Ser 1.48 (*)    Calcium 8.6 (*)    Total Protein 5.6 (*)    Albumin 1.7 (*)    Alkaline Phosphatase 277 (*)    GFR calc non Af Amer 53 (*)    All other components within normal limits  TROPONIN I - Abnormal; Notable for the following:    Troponin I 0.04 (*)    All other components within normal limits  URINALYSIS, ROUTINE W REFLEX MICROSCOPIC (NOT AT [facility]) - Abnormal; Notable for the following:    Glucose, UA >1000 (*)    Hgb urine dipstick MODERATE (*)    Protein, ur 100 (*)    All other components within normal limits  URINE MICROSCOPIC-ADD ON - Abnormal; Notable for the following:    Squamous Epithelial / LPF 0-5 (*)    Bacteria, UA RARE (*)    All other components within normal limits  CBG MONITORING, ED - Abnormal; Notable for the following:    Glucose-Capillary >600 (*)    All other components within normal limits  CBG MONITORING, ED - Abnormal; Notable for the following:    Glucose-Capillary >600 (*)    All other components within normal limits  CBG MONITORING, ED - Abnormal; Notable for the following:    Glucose-Capillary 533 (*)    All other components within normal limits  CBG MONITORING, ED - Abnormal; Notable for the following:    Glucose-Capillary 464 (*)    All other components within normal limits    Imaging Review Dg Knee Complete 4 Views Left  04/10/2016  CLINICAL DATA:   50 year old male with history of bilateral knee pain. Multiple falls in the last several days. EXAM: LEFT KNEE - COMPLETE 4+ VIEW COMPARISON:  No priors. FINDINGS: No evidence of fracture, dislocation, or joint effusion. No evidence of arthropathy or other focal bone abnormality. Soft tissues are unremarkable. IMPRESSION: Negative. Electronically Signed   By: Vinnie Langton M.D.   On: 04/10/2016 16:10   Dg Knee Complete  4 Views Right  04/10/2016  CLINICAL DATA:  Bilateral knee pain.  Recent falls. EXAM: RIGHT KNEE - COMPLETE 4+ VIEW COMPARISON:  None. FINDINGS: Soft tissue swelling in the prepatellar right knee. No fracture, joint effusion, malalignment, appreciable arthropathy or suspicious focal osseous lesion IMPRESSION: Prepatellar soft tissue swelling. No fracture, joint effusion or malalignment. Electronically Signed   By: Ilona Sorrel M.D.   On: 04/10/2016 16:12   I have personally reviewed and evaluated these images and lab results as part of my medical decision-making.   EKG Interpretation   Date/Time:  Saturday April 10 2016 14:33:18 EDT Ventricular Rate:  83 PR Interval:    QRS Duration: 134 QT Interval:  356 QTC Calculation: 419 R Axis:   70 Text Interpretation:  Sinus rhythm Probable left ventricular hypertrophy  Anterior Q waves, possibly due to LVH No significant change since last  tracing Confirmed by Christy Gentles  MD, Elenore Rota ([id number]) on 04/10/2016 2:44:06 PM      MDM   Final diagnoses:  Dehydration  Hyperglycemia  Renal insufficiency  Sprain of right knee, initial encounter  Sprain of left knee, initial encounter    Nursing notes including past medical history and social history reviewed and considered in documentation xrays/imaging reviewed by myself and considered during evaluation Labs/vital reviewed myself and considered during evaluation     Ripley Fraise, MD 04/10/16 1946

## 2016-04-10 NOTE — ED Notes (Signed)
CRITICAL VALUE ALERT  Critical value received:  Glucose 704 and troponin 0.04  Date of notification:  04/10/2016  Time of notification:  [id number]  Critical value read back:Yes.    Nurse who received alert:  Domenica Reamer RN   MD notified (1st page):  Dr Christy Gentles  Time of first page:  33  MD notified (2nd page):  Time of second page:  Responding MD:  Dr Christy Gentles  Time MD responded:  [phone]

## 2016-04-10 NOTE — Discharge Instructions (Signed)

## 2016-04-10 NOTE — ED Notes (Signed)
Per EMS: Pt reports right leg pain due to multiple falls over past 4 days.  Pt neurologically intact, ambulatory.  Pain is isolated to knee.  CBG read "HIGH", pt is diabetic, pt had 1000ccs fluid en route with ems.   131/94.  Pt has not eaten in past week, states that he is not hungry.  Pt has been drinking a lot lately (koolaid, water, soda), therefore, increased UOP.

## 2016-04-10 NOTE — ED Notes (Signed)
Pt provided diet drink at this time.

## 2016-04-10 NOTE — ED Notes (Signed)
MD at bedside. 

## 2016-04-10 NOTE — ED Notes (Signed)
Pt taken to XR.  

## 2016-04-13 DIAGNOSIS — E1140 Type 2 diabetes mellitus with diabetic neuropathy, unspecified: Secondary | ICD-10-CM | POA: Diagnosis not present

## 2016-04-13 DIAGNOSIS — I10 Essential (primary) hypertension: Secondary | ICD-10-CM | POA: Diagnosis not present

## 2016-05-10 ENCOUNTER — Other Ambulatory Visit: Payer: Self-pay | Admitting: "Endocrinology

## 2016-05-10 DIAGNOSIS — M545 Low back pain: Secondary | ICD-10-CM | POA: Diagnosis not present

## 2016-05-10 DIAGNOSIS — Z79891 Long term (current) use of opiate analgesic: Secondary | ICD-10-CM | POA: Diagnosis not present

## 2016-05-10 DIAGNOSIS — R1011 Right upper quadrant pain: Secondary | ICD-10-CM | POA: Diagnosis not present

## 2016-05-10 DIAGNOSIS — G894 Chronic pain syndrome: Secondary | ICD-10-CM | POA: Diagnosis not present

## 2016-05-10 DIAGNOSIS — K861 Other chronic pancreatitis: Secondary | ICD-10-CM | POA: Diagnosis not present

## 2016-05-14 DIAGNOSIS — E1140 Type 2 diabetes mellitus with diabetic neuropathy, unspecified: Secondary | ICD-10-CM | POA: Diagnosis not present

## 2016-05-14 DIAGNOSIS — J449 Chronic obstructive pulmonary disease, unspecified: Secondary | ICD-10-CM | POA: Diagnosis not present

## 2016-05-21 DIAGNOSIS — E559 Vitamin D deficiency, unspecified: Secondary | ICD-10-CM | POA: Diagnosis not present

## 2016-05-21 DIAGNOSIS — N183 Chronic kidney disease, stage 3 (moderate): Secondary | ICD-10-CM | POA: Diagnosis not present

## 2016-05-21 DIAGNOSIS — E1129 Type 2 diabetes mellitus with other diabetic kidney complication: Secondary | ICD-10-CM | POA: Diagnosis not present

## 2016-05-21 DIAGNOSIS — E871 Hypo-osmolality and hyponatremia: Secondary | ICD-10-CM | POA: Diagnosis not present

## 2016-05-28 ENCOUNTER — Other Ambulatory Visit (HOSPITAL_COMMUNITY): Payer: Self-pay | Admitting: Nephrology

## 2016-05-28 DIAGNOSIS — N183 Chronic kidney disease, stage 3 unspecified: Secondary | ICD-10-CM

## 2016-06-08 DIAGNOSIS — Z79891 Long term (current) use of opiate analgesic: Secondary | ICD-10-CM | POA: Diagnosis not present

## 2016-06-09 ENCOUNTER — Other Ambulatory Visit: Payer: Self-pay | Admitting: "Endocrinology

## 2016-06-14 DIAGNOSIS — E1142 Type 2 diabetes mellitus with diabetic polyneuropathy: Secondary | ICD-10-CM | POA: Diagnosis not present

## 2016-06-14 DIAGNOSIS — I10 Essential (primary) hypertension: Secondary | ICD-10-CM | POA: Diagnosis not present

## 2016-06-18 ENCOUNTER — Ambulatory Visit (HOSPITAL_COMMUNITY)
Admission: RE | Admit: 2016-06-18 | Discharge: 2016-06-18 | Disposition: A | Discharge status: Home / Self Care | Payer: Medicare Other | Source: Ambulatory Visit | Attending: Nephrology | Admitting: Nephrology

## 2016-06-18 DIAGNOSIS — R188 Other ascites: Secondary | ICD-10-CM | POA: Diagnosis not present

## 2016-06-18 DIAGNOSIS — D509 Iron deficiency anemia, unspecified: Secondary | ICD-10-CM | POA: Diagnosis not present

## 2016-06-18 DIAGNOSIS — Z79899 Other long term (current) drug therapy: Secondary | ICD-10-CM | POA: Diagnosis not present

## 2016-06-18 DIAGNOSIS — D519 Vitamin B12 deficiency anemia, unspecified: Secondary | ICD-10-CM | POA: Diagnosis not present

## 2016-06-18 DIAGNOSIS — N200 Calculus of kidney: Secondary | ICD-10-CM | POA: Insufficient documentation

## 2016-06-18 DIAGNOSIS — N183 Chronic kidney disease, stage 3 unspecified: Secondary | ICD-10-CM

## 2016-06-18 DIAGNOSIS — N281 Cyst of kidney, acquired: Secondary | ICD-10-CM | POA: Insufficient documentation

## 2016-06-18 DIAGNOSIS — I10 Essential (primary) hypertension: Secondary | ICD-10-CM | POA: Diagnosis not present

## 2016-06-18 DIAGNOSIS — E559 Vitamin D deficiency, unspecified: Secondary | ICD-10-CM | POA: Diagnosis not present

## 2016-06-18 DIAGNOSIS — R809 Proteinuria, unspecified: Secondary | ICD-10-CM | POA: Diagnosis not present

## 2016-06-21 ENCOUNTER — Other Ambulatory Visit: Payer: Self-pay | Admitting: "Endocrinology

## 2016-07-06 DIAGNOSIS — M6250 Muscle wasting and atrophy, not elsewhere classified, unspecified site: Secondary | ICD-10-CM | POA: Diagnosis not present

## 2016-07-06 DIAGNOSIS — Z79891 Long term (current) use of opiate analgesic: Secondary | ICD-10-CM | POA: Diagnosis not present

## 2016-07-06 DIAGNOSIS — R1011 Right upper quadrant pain: Secondary | ICD-10-CM | POA: Diagnosis not present

## 2016-07-06 DIAGNOSIS — K861 Other chronic pancreatitis: Secondary | ICD-10-CM | POA: Diagnosis not present

## 2016-07-06 DIAGNOSIS — G894 Chronic pain syndrome: Secondary | ICD-10-CM | POA: Diagnosis not present

## 2016-07-06 DIAGNOSIS — M5416 Radiculopathy, lumbar region: Secondary | ICD-10-CM | POA: Diagnosis not present

## 2016-07-06 DIAGNOSIS — M25551 Pain in right hip: Secondary | ICD-10-CM | POA: Diagnosis not present

## 2016-07-06 DIAGNOSIS — R2681 Unsteadiness on feet: Secondary | ICD-10-CM | POA: Diagnosis not present

## 2016-07-06 DIAGNOSIS — M25552 Pain in left hip: Secondary | ICD-10-CM | POA: Diagnosis not present

## 2016-07-07 DIAGNOSIS — E871 Hypo-osmolality and hyponatremia: Secondary | ICD-10-CM | POA: Diagnosis not present

## 2016-07-07 DIAGNOSIS — E559 Vitamin D deficiency, unspecified: Secondary | ICD-10-CM | POA: Diagnosis not present

## 2016-07-07 DIAGNOSIS — N183 Chronic kidney disease, stage 3 (moderate): Secondary | ICD-10-CM | POA: Diagnosis not present

## 2016-07-07 DIAGNOSIS — D638 Anemia in other chronic diseases classified elsewhere: Secondary | ICD-10-CM | POA: Diagnosis not present

## 2016-07-14 DIAGNOSIS — E1140 Type 2 diabetes mellitus with diabetic neuropathy, unspecified: Secondary | ICD-10-CM | POA: Diagnosis not present

## 2016-07-14 DIAGNOSIS — I10 Essential (primary) hypertension: Secondary | ICD-10-CM | POA: Diagnosis not present

## 2016-07-15 ENCOUNTER — Other Ambulatory Visit: Payer: Self-pay | Admitting: "Endocrinology

## 2016-07-19 ENCOUNTER — Encounter (HOSPITAL_COMMUNITY): Payer: Self-pay | Admitting: Emergency Medicine

## 2016-07-19 ENCOUNTER — Emergency Department (HOSPITAL_COMMUNITY): Payer: Medicare Other

## 2016-07-19 ENCOUNTER — Emergency Department (HOSPITAL_COMMUNITY)
Admission: EM | Admit: 2016-07-19 | Discharge: 2016-07-19 | Disposition: A | Discharge status: Home / Self Care | Payer: Medicare Other | Attending: Emergency Medicine | Admitting: Emergency Medicine

## 2016-07-19 DIAGNOSIS — N049 Nephrotic syndrome with unspecified morphologic changes: Secondary | ICD-10-CM | POA: Diagnosis not present

## 2016-07-19 DIAGNOSIS — Z794 Long term (current) use of insulin: Secondary | ICD-10-CM | POA: Diagnosis not present

## 2016-07-19 DIAGNOSIS — R140 Abdominal distension (gaseous): Secondary | ICD-10-CM | POA: Diagnosis not present

## 2016-07-19 DIAGNOSIS — I10 Essential (primary) hypertension: Secondary | ICD-10-CM | POA: Diagnosis not present

## 2016-07-19 DIAGNOSIS — F17210 Nicotine dependence, cigarettes, uncomplicated: Secondary | ICD-10-CM | POA: Insufficient documentation

## 2016-07-19 DIAGNOSIS — J9811 Atelectasis: Secondary | ICD-10-CM | POA: Diagnosis not present

## 2016-07-19 DIAGNOSIS — D649 Anemia, unspecified: Secondary | ICD-10-CM | POA: Insufficient documentation

## 2016-07-19 DIAGNOSIS — N5089 Other specified disorders of the male genital organs: Secondary | ICD-10-CM | POA: Diagnosis present

## 2016-07-19 DIAGNOSIS — R531 Weakness: Secondary | ICD-10-CM

## 2016-07-19 DIAGNOSIS — Z79899 Other long term (current) drug therapy: Secondary | ICD-10-CM | POA: Diagnosis not present

## 2016-07-19 DIAGNOSIS — E119 Type 2 diabetes mellitus without complications: Secondary | ICD-10-CM | POA: Diagnosis not present

## 2016-07-19 LAB — CBC WITH DIFFERENTIAL/PLATELET
Basophils Absolute: 0 10*3/uL (ref 0.0–0.1)
Basophils Relative: 1 %
EOS PCT: 4 %
Eosinophils Absolute: 0.3 10*3/uL (ref 0.0–0.7)
HEMATOCRIT: 30.5 % — AB (ref 39.0–52.0)
Hemoglobin: 9.9 g/dL — ABNORMAL LOW (ref 13.0–17.0)
LYMPHS ABS: 1.6 10*3/uL (ref 0.7–4.0)
LYMPHS PCT: 22 %
MCH: 27.2 pg (ref 26.0–34.0)
MCHC: 32.5 g/dL (ref 30.0–36.0)
MCV: 83.8 fL (ref 78.0–100.0)
MONO ABS: 0.5 10*3/uL (ref 0.1–1.0)
Monocytes Relative: 7 %
Neutro Abs: 4.7 10*3/uL (ref 1.7–7.7)
Neutrophils Relative %: 66 %
PLATELETS: 291 10*3/uL (ref 150–400)
RBC: 3.64 MIL/uL — AB (ref 4.22–5.81)
RDW: 15.2 % (ref 11.5–15.5)
WBC: 7.1 10*3/uL (ref 4.0–10.5)

## 2016-07-19 LAB — URINALYSIS, ROUTINE W REFLEX MICROSCOPIC
BILIRUBIN URINE: NEGATIVE
GLUCOSE, UA: NEGATIVE mg/dL
KETONES UR: NEGATIVE mg/dL
LEUKOCYTES UA: NEGATIVE
Nitrite: NEGATIVE
PH: 6 (ref 5.0–8.0)
PROTEIN: 100 mg/dL — AB
Specific Gravity, Urine: 1.02 (ref 1.005–1.030)

## 2016-07-19 LAB — COMPREHENSIVE METABOLIC PANEL
ALT: 12 U/L — AB (ref 17–63)
AST: 24 U/L (ref 15–41)
Albumin: 1.3 g/dL — ABNORMAL LOW (ref 3.5–5.0)
Alkaline Phosphatase: 490 U/L — ABNORMAL HIGH (ref 38–126)
Anion gap: 6 (ref 5–15)
BILIRUBIN TOTAL: 0.3 mg/dL (ref 0.3–1.2)
BUN: 19 mg/dL (ref 6–20)
CHLORIDE: 108 mmol/L (ref 101–111)
CO2: 25 mmol/L (ref 22–32)
CREATININE: 1.36 mg/dL — AB (ref 0.61–1.24)
Calcium: 7.8 mg/dL — ABNORMAL LOW (ref 8.9–10.3)
GFR, EST NON AFRICAN AMERICAN: 58 mL/min — AB (ref >60)
Glucose, Bld: 125 mg/dL — ABNORMAL HIGH (ref 65–99)
Potassium: 3.8 mmol/L (ref 3.5–5.1)
Sodium: 139 mmol/L (ref 135–145)
TOTAL PROTEIN: 5.3 g/dL — AB (ref 6.5–8.1)

## 2016-07-19 LAB — URINE MICROSCOPIC-ADD ON

## 2016-07-19 LAB — POC OCCULT BLOOD, ED: Fecal Occult Bld: NEGATIVE

## 2016-07-19 LAB — AMMONIA: AMMONIA: 22 umol/L (ref 9–35)

## 2016-07-19 LAB — BRAIN NATRIURETIC PEPTIDE: B NATRIURETIC PEPTIDE 5: 201 pg/mL — AB (ref 0.0–100.0)

## 2016-07-19 NOTE — ED Notes (Signed)
Patient transported to X-ray 

## 2016-07-19 NOTE — ED Triage Notes (Signed)
Pt with edema of the testicles and penis since Fri.

## 2016-07-19 NOTE — ED Provider Notes (Signed)
[facility] DEPT Provider Note   CSN: [id number] Arrival date & time: 07/19/16  1342     History   Chief Complaint Chief Complaint  Patient presents with  . Groin Swelling    HPI [name] [name] is a 50 y.o. male.  Scrotal edema for a few days and progressively worsening. Also with some abdomi9nal distension. Increased weakness recently and hasn't been getting out of bed like he should. No other symptoms. No pain, difficulty urination or other associated symptoms. No modifying factors. No history of the same.      Past Medical History:  Diagnosis Date  . Anxiety   . Diabetes mellitus   . GERD (gastroesophageal reflux disease)   . Hypercholesteremia   . Hypertension   . Neuropathy ([facility])   . Pancreatitis chronic     Patient Active Problem List   Diagnosis Date Noted  . Primary hypothyroidism 07/30/2015  . Pancreatic insufficiency 07/30/2015  . Type I diabetes mellitus, uncontrolled ([place]) 01/26/2008  . Hyperlipidemia 01/26/2008  . DEPRESSION/ANXIETY 01/26/2008  . NICOTINE ADDICTION 01/26/2008  . Essential hypertension, benign 01/26/2008  . GERD 01/26/2008  . BACK PAIN, CHRONIC 01/26/2008  . ABDOMINAL PAIN, CHRONIC 01/26/2008    Past Surgical History:  Procedure Laterality Date  . CATARACT EXTRACTION W/PHACO Left 12/10/2013   Procedure: CATARACT EXTRACTION PHACO AND INTRAOCULAR LENS PLACEMENT (IOC);  Surgeon: Tonny Branch, MD;  Location: [facility];  Service: Ophthalmology;  Laterality: Left;  CDE: 9.26  . CATARACT EXTRACTION W/PHACO Right 11/26/2013   Procedure: CATARACT EXTRACTION PHACO AND INTRAOCULAR LENS PLACEMENT (IOC);  Surgeon: Tonny Branch, MD;  Location: [facility];  Service: Ophthalmology;  Laterality: Right;  CDE:14.53  . CHOLECYSTECTOMY    . COLON SURGERY     removal of colon from blockage-took half of colon out.       Home Medications    Prior to Admission medications   Medication Sig Start Date End Date Taking? Authorizing Provider  albuterol  (PROAIR HFA) 108 (90 Base) MCG/ACT inhaler Inhale 2 puffs into the lungs every 6 (six) hours as needed for wheezing or shortness of breath.    Yes Historical Provider, MD  Aspirin-Salicylamide-Caffeine (BC HEADACHE POWDER PO) Take 1 packet by mouth daily as needed (for pain).    Yes Historical Provider, MD  buPROPion (WELLBUTRIN XL) 300 MG 24 hr tablet Take 300 mg by mouth daily.   Yes Historical Provider, MD  cilostazol (PLETAL) 50 MG tablet Take 50 mg by mouth 2 (two) times daily.   Yes Historical Provider, MD  citalopram (CELEXA) 20 MG tablet Take 20 mg by mouth daily.   Yes Historical Provider, MD  CREON 24000 units CPEP Take 24,000 Units by mouth 3 (three) times daily. 03/19/16  Yes Historical Provider, MD  fish oil-omega-3 fatty acids 1000 MG capsule Take 1 g by mouth 2 (two) times daily.   Yes Historical Provider, MD  furosemide (LASIX) 40 MG tablet Take 20 mg by mouth 2 (two) times daily.  07/07/16  Yes Historical Provider, MD  gabapentin (NEURONTIN) 300 MG capsule Take 600 mg by mouth 3 (three) times daily.   Yes Historical Provider, MD  gemfibrozil (LOPID) 600 MG tablet Take 600 mg by mouth 2 (two) times daily.   Yes Historical Provider, MD  insulin aspart (NOVOLOG) 100 UNIT/ML injection Inject 1-12 Units into the skin 3 (three) times daily with meals. Per sliding scale instructions   Yes Historical Provider, MD  levothyroxine (SYNTHROID, LEVOTHROID) 75 MCG tablet TAKE 1 TABLET BY MOUTH EVERY  MORNING. 03/05/16  Yes Cassandria Anger, MD  metoCLOPramide (REGLAN) 5 MG tablet Take 5 mg by mouth 4 (four) times daily.   Yes Historical Provider, MD  omeprazole (PRILOSEC) 20 MG capsule Take 20 mg by mouth 2 (two) times daily before a meal.    Yes Historical Provider, MD  oxycodone (ROXICODONE) 30 MG immediate release tablet Take 30 mg by mouth every 4 (four) hours. 04/08/16  Yes Historical Provider, MD  ramipril (ALTACE) 10 MG capsule Take 10 mg by mouth daily.  07/07/16  Yes Historical Provider, MD    simvastatin (ZOCOR) 40 MG tablet Take 40 mg by mouth daily.    Yes Historical Provider, MD  tiZANidine (ZANAFLEX) 2 MG tablet Take 2-4 mg by mouth 2 (two) times daily. **Take one tablet by mouth in the morning and two tablets in the evening**   Yes Historical Provider, MD  TOUJEO SOLOSTAR 300 UNIT/ML SOPN INJECT 26 UNITS SUBCUTANEOUSLY AT BEDTIME. 03/05/16  Yes Cassandria Anger, MD  Vitamin D, Ergocalciferol, (DRISDOL) 50000 units CAPS capsule TAKE ONE CAPSULE BY MOUTH ONCE WEEKLY. Patient taking differently: TAKE ONE CAPSULE BY MOUTH ONCE WEEKLY ON [facility] 05/11/16  Yes Cassandria Anger, MD  glucose blood (ONE TOUCH TEST STRIPS) test strip Use as instructed 07/30/15   Cassandria Anger, MD    Family History Family History  Problem Relation Age of Onset  . Diabetes Mother     Social History Social History  Substance Use Topics  . Smoking status: Current Every Day Smoker    Packs/day: 1.00    Years: 30.00    Types: Cigarettes  . Smokeless tobacco: Never Used  . Alcohol use No     Allergies   Review of patient's allergies indicates no known allergies.   Review of Systems Review of Systems  Constitutional: Positive for fatigue. Negative for fever.  Respiratory: Negative for cough, shortness of breath and stridor.   Cardiovascular: Negative for chest pain.  Gastrointestinal: Positive for abdominal distention. Negative for diarrhea, nausea and vomiting.  Genitourinary: Positive for decreased urine volume. Negative for difficulty urinating, dysuria, flank pain and frequency.  Neurological: Positive for weakness. Negative for dizziness and facial asymmetry.  All other systems reviewed and are negative.    Physical Exam Updated Vital Signs BP 142/87 (BP Location: Right Arm)   Pulse 73   Temp 98.3 F (36.8 C) (Oral)   Resp 18   Ht 5\' 6"  (1.676 m)   Wt 134 lb (60.8 kg)   SpO2 95%   BMI 21.63 kg/m   Physical Exam  Constitutional: He is oriented to person,  place, and time. He appears well-developed and well-nourished.  HENT:  Head: Normocephalic and atraumatic.  Eyes: Conjunctivae are normal.  Neck: Neck supple.  Cardiovascular: Normal rate and regular rhythm.   No murmur heard. Pulmonary/Chest: Effort normal and breath sounds normal. No respiratory distress.  Abdominal: Soft. He exhibits distension (no guarding). There is no tenderness.  Genitourinary: Testes normal. Right testis shows no mass and no tenderness. Left testis shows no mass and no tenderness.  Genitourinary Comments: Significant scrotal edema but no pain, erythema, induration or warmth  Musculoskeletal: He exhibits no edema or deformity.  Neurological: He is alert and oriented to person, place, and time.  Skin: Skin is warm and dry.  Psychiatric: He has a normal mood and affect.  Nursing note and vitals reviewed.    ED Treatments / Results  Labs (all labs ordered are listed, but only abnormal results are  displayed) Labs Reviewed  CBC WITH DIFFERENTIAL/PLATELET - Abnormal; Notable for the following:       Result Value   RBC 3.64 (*)    Hemoglobin 9.9 (*)    HCT 30.5 (*)    All other components within normal limits  COMPREHENSIVE METABOLIC PANEL - Abnormal; Notable for the following:    Glucose, Bld 125 (*)    Creatinine, Ser 1.36 (*)    Calcium 7.8 (*)    Total Protein 5.3 (*)    Albumin 1.3 (*)    ALT 12 (*)    Alkaline Phosphatase 490 (*)    GFR calc non Af Amer 58 (*)    All other components within normal limits  BRAIN NATRIURETIC PEPTIDE - Abnormal; Notable for the following:    B Natriuretic Peptide 201.0 (*)    All other components within normal limits  URINALYSIS, ROUTINE W REFLEX MICROSCOPIC (NOT AT [facility]) - Abnormal; Notable for the following:    Hgb urine dipstick SMALL (*)    Protein, ur 100 (*)    All other components within normal limits  URINE MICROSCOPIC-ADD ON - Abnormal; Notable for the following:    Squamous Epithelial / LPF 0-5 (*)     Bacteria, UA RARE (*)    Casts HYALINE CASTS (*)    All other components within normal limits  AMMONIA  POC OCCULT BLOOD, ED    EKG  EKG Interpretation None       Radiology Dg Chest 2 View  Result Date: 07/19/2016 CLINICAL DATA:  Edema of the testicles and penis for 3 days. EXAM: CHEST  2 VIEW COMPARISON:  07/30/2015 and 05/15/2013 radiographs. FINDINGS: Suboptimal inspiration, especially on the lateral view. There is mild eventration of the right hemidiaphragm. The heart size and mediastinal contours are stable. The lungs appear clear on the frontal examination, but under aerated on the lateral view. No pneumothorax or significant pleural effusion identified. Left upper quadrant abdominal calcification appears unchanged. IMPRESSION: Suboptimal inspiration on the lateral view, resulting in bibasilar atelectasis. No definite acute findings. Electronically Signed   By: Richardean Sale M.D.   On: 07/19/2016 16:23    Procedures Procedures (including critical care time)  Medications Ordered in ED Medications - No data to display   Initial Impression / Assessment and Plan / ED Course  I have reviewed the triage vital signs and the nursing notes.  Pertinent labs & imaging results that were available during my care of the patient were reviewed by me and considered in my medical decision making (see chart for details).  Clinical Course   Scrotal edema without pain, decreased UOP, increased weakness. Will evaluate for liver failure, renal failure. If ok, will defer to nephro to manage CKD and get care management for PT/OT.  Labs okay. Likely nephrotic syndrome. Will need to follow up with primary doctor or nephrologist for the same. Order written for physical therapy and care management.  Final Clinical Impressions(s) / ED Diagnoses   Final diagnoses:  Nephrotic syndrome  Anemia, unspecified type  Weakness    New Prescriptions Discharge Medication List as of 07/19/2016  7:03 PM         Merrily Pew, MD 07/20/16 0003

## 2016-07-21 ENCOUNTER — Other Ambulatory Visit: Payer: Self-pay | Admitting: "Endocrinology

## 2016-07-29 ENCOUNTER — Other Ambulatory Visit: Payer: Self-pay | Admitting: "Endocrinology

## 2016-08-03 DIAGNOSIS — M545 Low back pain: Secondary | ICD-10-CM | POA: Diagnosis not present

## 2016-08-03 DIAGNOSIS — K861 Other chronic pancreatitis: Secondary | ICD-10-CM | POA: Diagnosis not present

## 2016-08-03 DIAGNOSIS — R1011 Right upper quadrant pain: Secondary | ICD-10-CM | POA: Diagnosis not present

## 2016-08-03 DIAGNOSIS — M6281 Muscle weakness (generalized): Secondary | ICD-10-CM | POA: Diagnosis not present

## 2016-08-03 DIAGNOSIS — R2681 Unsteadiness on feet: Secondary | ICD-10-CM | POA: Diagnosis not present

## 2016-08-03 DIAGNOSIS — G894 Chronic pain syndrome: Secondary | ICD-10-CM | POA: Diagnosis not present

## 2016-08-03 DIAGNOSIS — Z79891 Long term (current) use of opiate analgesic: Secondary | ICD-10-CM | POA: Diagnosis not present
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
## 2016-08-10 ENCOUNTER — Encounter: Payer: Self-pay | Admitting: Cardiovascular Disease

## 2016-08-10 ENCOUNTER — Ambulatory Visit (INDEPENDENT_AMBULATORY_CARE_PROVIDER_SITE_OTHER): Payer: Medicare Other | Admitting: Cardiovascular Disease

## 2016-08-10 VITALS — BP 140/90 | HR 98 | Ht 66.0 in | Wt 129.0 lb

## 2016-08-10 DIAGNOSIS — G259 Extrapyramidal and movement disorder, unspecified: Secondary | ICD-10-CM

## 2016-08-10 DIAGNOSIS — R0602 Shortness of breath: Secondary | ICD-10-CM

## 2016-08-10 NOTE — Patient Instructions (Signed)

## 2016-08-11 ENCOUNTER — Other Ambulatory Visit: Payer: Self-pay | Admitting: "Endocrinology

## 2016-08-19 ENCOUNTER — Other Ambulatory Visit (HOSPITAL_COMMUNITY): Payer: Self-pay | Admitting: Internal Medicine

## 2016-08-19 ENCOUNTER — Other Ambulatory Visit: Payer: Self-pay | Admitting: "Endocrinology

## 2016-08-19 DIAGNOSIS — Z23 Encounter for immunization: Secondary | ICD-10-CM | POA: Diagnosis not present

## 2016-08-19 DIAGNOSIS — E118 Type 2 diabetes mellitus with unspecified complications: Principal | ICD-10-CM

## 2016-08-19 DIAGNOSIS — R188 Other ascites: Secondary | ICD-10-CM

## 2016-08-19 DIAGNOSIS — R269 Unspecified abnormalities of gait and mobility: Secondary | ICD-10-CM | POA: Diagnosis not present

## 2016-08-19 DIAGNOSIS — E1165 Type 2 diabetes mellitus with hyperglycemia: Secondary | ICD-10-CM | POA: Diagnosis not present

## 2016-08-19 DIAGNOSIS — E039 Hypothyroidism, unspecified: Secondary | ICD-10-CM

## 2016-08-19 DIAGNOSIS — E1140 Type 2 diabetes mellitus with diabetic neuropathy, unspecified: Secondary | ICD-10-CM | POA: Diagnosis not present

## 2016-08-19 DIAGNOSIS — E8809 Other disorders of plasma-protein metabolism, not elsewhere classified: Secondary | ICD-10-CM | POA: Diagnosis not present

## 2016-08-20 ENCOUNTER — Ambulatory Visit (HOSPITAL_COMMUNITY)
Admission: RE | Admit: 2016-08-20 | Discharge: 2016-08-20 | Disposition: A | Discharge status: Home / Self Care | Payer: Medicare Other | Source: Ambulatory Visit | Attending: Cardiovascular Disease | Admitting: Cardiovascular Disease

## 2016-08-20 ENCOUNTER — Other Ambulatory Visit: Payer: Self-pay | Admitting: "Endocrinology

## 2016-08-20 DIAGNOSIS — J90 Pleural effusion, not elsewhere classified: Secondary | ICD-10-CM | POA: Diagnosis not present

## 2016-08-20 DIAGNOSIS — I10 Essential (primary) hypertension: Secondary | ICD-10-CM | POA: Insufficient documentation

## 2016-08-20 DIAGNOSIS — E119 Type 2 diabetes mellitus without complications: Secondary | ICD-10-CM | POA: Insufficient documentation

## 2016-08-20 DIAGNOSIS — R0602 Shortness of breath: Secondary | ICD-10-CM | POA: Insufficient documentation

## 2016-08-20 DIAGNOSIS — I313 Pericardial effusion (noninflammatory): Secondary | ICD-10-CM | POA: Insufficient documentation

## 2016-08-20 DIAGNOSIS — E785 Hyperlipidemia, unspecified: Secondary | ICD-10-CM | POA: Insufficient documentation

## 2016-08-20 LAB — ECHOCARDIOGRAM COMPLETE
E decel time: 268 ms
E/e' ratio: 9.4
FS: 32 % (ref 28–44)
IVS/LV PW RATIO, ED: 1.06
LA ID, A-P, ES: 30 mm
LA diam end sys: 30 mm
LA diam index: 1.82 cm/m2
LA vol A4C: 37.7 mL
LA vol index: 26.1 mL/m2
LA vol: 43.1 mL
LV E/e' medial: 9.4
LV E/e'average: 9.4
LV PW d: 14 mm — AB (ref 0.6–1.1)
LV dias vol index: 47 mL/m2
LV dias vol: 77 mL (ref 62–150)
LV e' LATERAL: 6.53 cm/s
LV sys vol index: 16 mL/m2
LV sys vol: 26 mL (ref 21–61)
LVOT SV: 80 mL
LVOT VTI: 25.4 cm
LVOT area: 3.14 cm2
LVOT diameter: 20 mm
LVOT peak grad rest: 7 mmHg
LVOT peak vel: 130 cm/s
Lateral S' vel: 13.3 cm/s
MV Dec: 268
MV pk A vel: 90.9 m/s
MV pk E vel: 61.4 m/s
Simpson's disk: 66
Stroke v: 51 mL
TAPSE: 17.9 mm
TDI e' lateral: 6.53
TDI e' medial: 5

## 2016-08-20 LAB — TSH: TSH: 4.34 mIU/L (ref 0.40–4.50)

## 2016-08-20 LAB — BASIC METABOLIC PANEL WITH GFR
BUN: 14 mg/dL (ref 7–25)
CO2: 27 mmol/L (ref 20–31)
Calcium: 7.6 mg/dL — ABNORMAL LOW (ref 8.6–10.3)
Chloride: 104 mmol/L (ref 98–110)
Creat: 1.35 mg/dL — ABNORMAL HIGH (ref 0.70–1.33)
Glucose, Bld: 257 mg/dL — ABNORMAL HIGH (ref 65–99)
Potassium: 3.2 mmol/L — ABNORMAL LOW (ref 3.5–5.3)
Sodium: 140 mmol/L (ref 135–146)

## 2016-08-20 LAB — T4, FREE: Free T4: 0.6 ng/dL — ABNORMAL LOW (ref 0.8–1.8)

## 2016-08-20 LAB — HEMOGLOBIN A1C
HEMOGLOBIN A1C: 11.8 % — AB (ref <5.7)
Mean Plasma Glucose: 292 mg/dL

## 2016-08-20 NOTE — Progress Notes (Signed)
*  PRELIMINARY RESULTS* Echocardiogram 2D Echocardiogram has been performed.  Samuel Germany 08/20/2016, 2:48 PM

## 2016-08-23 ENCOUNTER — Telehealth: Payer: Self-pay

## 2016-08-23 NOTE — Telephone Encounter (Signed)
-----   Message from Josue Hector, MD sent at 08/22/2016  8:27 PM EST ----- EF normal no bad valves overall good

## 2016-08-23 NOTE — Telephone Encounter (Signed)
Called pt, no answer. Left voicemail to return call.

## 2016-08-25 ENCOUNTER — Ambulatory Visit (HOSPITAL_COMMUNITY): Payer: Medicare Other

## 2016-08-31 ENCOUNTER — Ambulatory Visit (HOSPITAL_COMMUNITY): Payer: Medicare Other

## 2016-08-31 DIAGNOSIS — M6281 Muscle weakness (generalized): Secondary | ICD-10-CM | POA: Diagnosis not present

## 2016-08-31 DIAGNOSIS — R2681 Unsteadiness on feet: Secondary | ICD-10-CM | POA: Diagnosis not present

## 2016-08-31 DIAGNOSIS — K861 Other chronic pancreatitis: Secondary | ICD-10-CM | POA: Diagnosis not present

## 2016-08-31 DIAGNOSIS — R1011 Right upper quadrant pain: Secondary | ICD-10-CM | POA: Diagnosis not present

## 2016-08-31 DIAGNOSIS — Z79891 Long term (current) use of opiate analgesic: Secondary | ICD-10-CM | POA: Diagnosis not present

## 2016-08-31 DIAGNOSIS — G894 Chronic pain syndrome: Secondary | ICD-10-CM | POA: Diagnosis not present

## 2016-09-01 ENCOUNTER — Ambulatory Visit (HOSPITAL_COMMUNITY): Admission: RE | Admit: 2016-09-01 | Payer: Medicare Other | Source: Ambulatory Visit

## 2016-09-03 ENCOUNTER — Other Ambulatory Visit (HOSPITAL_COMMUNITY): Payer: Self-pay | Admitting: Internal Medicine

## 2016-09-03 ENCOUNTER — Ambulatory Visit (HOSPITAL_COMMUNITY)
Admission: RE | Admit: 2016-09-03 | Discharge: 2016-09-03 | Disposition: A | Discharge status: Home / Self Care | Payer: Medicare Other | Source: Ambulatory Visit | Attending: Internal Medicine | Admitting: Internal Medicine

## 2016-09-03 DIAGNOSIS — R188 Other ascites: Secondary | ICD-10-CM

## 2016-09-03 DIAGNOSIS — J90 Pleural effusion, not elsewhere classified: Secondary | ICD-10-CM | POA: Insufficient documentation

## 2016-09-08 ENCOUNTER — Encounter: Payer: Self-pay | Admitting: "Endocrinology

## 2016-09-08 ENCOUNTER — Telehealth: Payer: Self-pay | Admitting: "Endocrinology

## 2016-09-08 ENCOUNTER — Other Ambulatory Visit: Payer: Self-pay | Admitting: "Endocrinology

## 2016-09-08 ENCOUNTER — Ambulatory Visit (INDEPENDENT_AMBULATORY_CARE_PROVIDER_SITE_OTHER): Payer: Medicare Other | Admitting: "Endocrinology

## 2016-09-08 VITALS — BP 147/94 | HR 88 | Ht 66.0 in | Wt 129.0 lb

## 2016-09-08 DIAGNOSIS — IMO0002 Reserved for concepts with insufficient information to code with codable children: Secondary | ICD-10-CM

## 2016-09-08 DIAGNOSIS — I10 Essential (primary) hypertension: Secondary | ICD-10-CM | POA: Diagnosis not present

## 2016-09-08 DIAGNOSIS — K8689 Other specified diseases of pancreas: Secondary | ICD-10-CM

## 2016-09-08 DIAGNOSIS — E1065 Type 1 diabetes mellitus with hyperglycemia: Secondary | ICD-10-CM

## 2016-09-08 DIAGNOSIS — E782 Mixed hyperlipidemia: Secondary | ICD-10-CM | POA: Diagnosis not present

## 2016-09-08 DIAGNOSIS — E039 Hypothyroidism, unspecified: Secondary | ICD-10-CM | POA: Diagnosis not present

## 2016-09-08 DIAGNOSIS — E108 Type 1 diabetes mellitus with unspecified complications: Secondary | ICD-10-CM

## 2016-09-08 DIAGNOSIS — Z91199 Patient's noncompliance with other medical treatment and regimen due to unspecified reason: Secondary | ICD-10-CM

## 2016-09-08 DIAGNOSIS — Z9119 Patient's noncompliance with other medical treatment and regimen: Secondary | ICD-10-CM | POA: Diagnosis not present

## 2016-09-08 MED ORDER — GLUCOSE BLOOD VI STRP: ORAL_STRIP | 3 refills | Status: DC

## 2016-09-08 MED ORDER — INSULIN GLARGINE 300 UNIT/ML ~~LOC~~ SOPN: 20.0000 [IU] | PEN_INJECTOR | Freq: Every day | SUBCUTANEOUS | 2 refills | Status: DC

## 2016-09-08 MED ORDER — GLUCOSE BLOOD VI STRP
ORAL_STRIP | 5 refills | Status: DC
Start: 2016-09-08 — End: 2017-02-20
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
## 2016-09-08 NOTE — Telephone Encounter (Signed)
Patient has an easy max V meter

## 2016-09-16 DIAGNOSIS — N184 Chronic kidney disease, stage 4 (severe): Secondary | ICD-10-CM | POA: Diagnosis not present

## 2016-09-16 DIAGNOSIS — E8809 Other disorders of plasma-protein metabolism, not elsewhere classified: Secondary | ICD-10-CM | POA: Diagnosis not present

## 2016-09-16 DIAGNOSIS — E1140 Type 2 diabetes mellitus with diabetic neuropathy, unspecified: Secondary | ICD-10-CM | POA: Diagnosis not present

## 2016-09-16 DIAGNOSIS — R188 Other ascites: Secondary | ICD-10-CM | POA: Diagnosis not present

## 2016-09-16 DIAGNOSIS — J449 Chronic obstructive pulmonary disease, unspecified: Secondary | ICD-10-CM | POA: Diagnosis not present

## 2016-09-22 ENCOUNTER — Ambulatory Visit (INDEPENDENT_AMBULATORY_CARE_PROVIDER_SITE_OTHER): Payer: Medicare Other | Admitting: "Endocrinology

## 2016-09-22 ENCOUNTER — Encounter: Payer: Self-pay | Admitting: "Endocrinology

## 2016-09-22 VITALS — BP 146/90 | HR 100 | Resp 18 | Ht 66.0 in | Wt 136.0 lb

## 2016-09-22 DIAGNOSIS — K8689 Other specified diseases of pancreas: Secondary | ICD-10-CM | POA: Diagnosis not present

## 2016-09-22 DIAGNOSIS — E039 Hypothyroidism, unspecified: Secondary | ICD-10-CM

## 2016-09-22 DIAGNOSIS — E1065 Type 1 diabetes mellitus with hyperglycemia: Secondary | ICD-10-CM | POA: Diagnosis not present

## 2016-09-22 DIAGNOSIS — IMO0002 Reserved for concepts with insufficient information to code with codable children: Secondary | ICD-10-CM

## 2016-09-22 DIAGNOSIS — E108 Type 1 diabetes mellitus with unspecified complications: Secondary | ICD-10-CM

## 2016-09-22 DIAGNOSIS — Z9119 Patient's noncompliance with other medical treatment and regimen: Secondary | ICD-10-CM

## 2016-09-22 DIAGNOSIS — E782 Mixed hyperlipidemia: Secondary | ICD-10-CM | POA: Diagnosis not present

## 2016-09-22 DIAGNOSIS — I10 Essential (primary) hypertension: Secondary | ICD-10-CM | POA: Diagnosis not present

## 2016-09-22 DIAGNOSIS — Z91199 Patient's noncompliance with other medical treatment and regimen due to unspecified reason: Secondary | ICD-10-CM
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
## 2016-09-29 DIAGNOSIS — R1011 Right upper quadrant pain: Secondary | ICD-10-CM | POA: Diagnosis not present

## 2016-09-29 DIAGNOSIS — G894 Chronic pain syndrome: Secondary | ICD-10-CM | POA: Diagnosis not present

## 2016-09-29 DIAGNOSIS — Z79891 Long term (current) use of opiate analgesic: Secondary | ICD-10-CM | POA: Diagnosis not present

## 2016-09-29 DIAGNOSIS — R2681 Unsteadiness on feet: Secondary | ICD-10-CM | POA: Diagnosis not present

## 2016-09-29 DIAGNOSIS — K861 Other chronic pancreatitis: Secondary | ICD-10-CM | POA: Diagnosis not present

## 2016-10-17 DIAGNOSIS — E1142 Type 2 diabetes mellitus with diabetic polyneuropathy: Secondary | ICD-10-CM | POA: Diagnosis not present

## 2016-10-17 DIAGNOSIS — R269 Unspecified abnormalities of gait and mobility: Secondary | ICD-10-CM | POA: Diagnosis not present

## 2016-10-25 ENCOUNTER — Encounter (HOSPITAL_COMMUNITY): Payer: Self-pay | Admitting: Emergency Medicine

## 2016-10-25 ENCOUNTER — Emergency Department (HOSPITAL_COMMUNITY)
Admission: EM | Admit: 2016-10-25 | Discharge: 2016-10-25 | Discharge status: Against Medical Advice | Payer: Medicare Other | Source: Home / Self Care

## 2016-10-25 ENCOUNTER — Encounter (HOSPITAL_COMMUNITY): Payer: Self-pay | Admitting: *Deleted

## 2016-10-25 ENCOUNTER — Emergency Department (HOSPITAL_COMMUNITY)
Admission: EM | Admit: 2016-10-25 | Discharge: 2016-10-25 | Disposition: A | Discharge status: Home / Self Care | Payer: Medicare Other | Attending: Emergency Medicine | Admitting: Emergency Medicine

## 2016-10-25 DIAGNOSIS — F17210 Nicotine dependence, cigarettes, uncomplicated: Secondary | ICD-10-CM | POA: Insufficient documentation

## 2016-10-25 DIAGNOSIS — I10 Essential (primary) hypertension: Secondary | ICD-10-CM | POA: Insufficient documentation

## 2016-10-25 DIAGNOSIS — E1140 Type 2 diabetes mellitus with diabetic neuropathy, unspecified: Secondary | ICD-10-CM | POA: Diagnosis not present

## 2016-10-25 DIAGNOSIS — Z5321 Procedure and treatment not carried out due to patient leaving prior to being seen by health care provider: Secondary | ICD-10-CM | POA: Insufficient documentation

## 2016-10-25 DIAGNOSIS — R531 Weakness: Secondary | ICD-10-CM | POA: Diagnosis not present

## 2016-10-25 LAB — CBC
HEMATOCRIT: 32.2 % — AB (ref 39.0–52.0)
HEMOGLOBIN: 10.5 g/dL — AB (ref 13.0–17.0)
MCH: 25.9 pg — ABNORMAL LOW (ref 26.0–34.0)
MCHC: 32.6 g/dL (ref 30.0–36.0)
MCV: 79.3 fL (ref 78.0–100.0)
Platelets: 258 10*3/uL (ref 150–400)
RBC: 4.06 MIL/uL — ABNORMAL LOW (ref 4.22–5.81)
RDW: 16.1 % — AB (ref 11.5–15.5)
WBC: 6.3 10*3/uL (ref 4.0–10.5)

## 2016-10-25 LAB — COMPREHENSIVE METABOLIC PANEL
ALT: 13 U/L — ABNORMAL LOW (ref 17–63)
ANION GAP: 9 (ref 5–15)
AST: 23 U/L (ref 15–41)
Albumin: 1 g/dL — ABNORMAL LOW (ref 3.5–5.0)
Alkaline Phosphatase: 434 U/L — ABNORMAL HIGH (ref 38–126)
BILIRUBIN TOTAL: 0.2 mg/dL — AB (ref 0.3–1.2)
BUN: 18 mg/dL (ref 6–20)
CO2: 25 mmol/L (ref 22–32)
Calcium: 7.6 mg/dL — ABNORMAL LOW (ref 8.9–10.3)
Chloride: 103 mmol/L (ref 101–111)
Creatinine, Ser: 1.28 mg/dL — ABNORMAL HIGH (ref 0.61–1.24)
GFR calc Af Amer: >60 mL/min (ref >60)
Glucose, Bld: 484 mg/dL — ABNORMAL HIGH (ref 65–99)
POTASSIUM: 3.3 mmol/L — AB (ref 3.5–5.1)
Sodium: 137 mmol/L (ref 135–145)
TOTAL PROTEIN: 5.3 g/dL — AB (ref 6.5–8.1)

## 2016-10-25 LAB — LIPASE, BLOOD: Lipase: 15 U/L (ref 11–51)

## 2016-10-25 NOTE — ED Triage Notes (Signed)
Pt left [facility] to come here due to closer at home.  Pt had blood drawn at [facility] as well.  Pt c/o leg swelling and pain and unable to walk.

## 2016-10-25 NOTE — ED Triage Notes (Signed)
Pt here with fluid in legs and abd area with generalized weakness

## 2016-10-25 NOTE — ED Notes (Signed)
Reported by registration that pt left without being seen

## 2016-10-27 ENCOUNTER — Inpatient Hospital Stay (HOSPITAL_COMMUNITY)
Admission: EM | Admit: 2016-10-27 | Discharge: 2016-11-03 | DRG: 432 | Disposition: A | Discharge status: Skilled Nursing Facility | Payer: Medicare Other | Attending: Internal Medicine | Admitting: Internal Medicine

## 2016-10-27 ENCOUNTER — Encounter (HOSPITAL_COMMUNITY): Payer: Self-pay | Admitting: Emergency Medicine

## 2016-10-27 ENCOUNTER — Emergency Department (HOSPITAL_COMMUNITY): Payer: Medicare Other

## 2016-10-27 DIAGNOSIS — E162 Hypoglycemia, unspecified: Secondary | ICD-10-CM

## 2016-10-27 DIAGNOSIS — R188 Other ascites: Secondary | ICD-10-CM | POA: Diagnosis present

## 2016-10-27 DIAGNOSIS — Z800 Family history of malignant neoplasm of digestive organs: Secondary | ICD-10-CM

## 2016-10-27 DIAGNOSIS — E108 Type 1 diabetes mellitus with unspecified complications: Secondary | ICD-10-CM | POA: Diagnosis not present

## 2016-10-27 DIAGNOSIS — Z794 Long term (current) use of insulin: Secondary | ICD-10-CM | POA: Diagnosis not present

## 2016-10-27 DIAGNOSIS — R601 Generalized edema: Secondary | ICD-10-CM | POA: Diagnosis not present

## 2016-10-27 DIAGNOSIS — K7290 Hepatic failure, unspecified without coma: Secondary | ICD-10-CM | POA: Diagnosis present

## 2016-10-27 DIAGNOSIS — I10 Essential (primary) hypertension: Secondary | ICD-10-CM | POA: Diagnosis present

## 2016-10-27 DIAGNOSIS — K8689 Other specified diseases of pancreas: Secondary | ICD-10-CM | POA: Diagnosis not present

## 2016-10-27 DIAGNOSIS — Z79899 Other long term (current) drug therapy: Secondary | ICD-10-CM | POA: Diagnosis not present

## 2016-10-27 DIAGNOSIS — E039 Hypothyroidism, unspecified: Secondary | ICD-10-CM | POA: Diagnosis present

## 2016-10-27 DIAGNOSIS — E1043 Type 1 diabetes mellitus with diabetic autonomic (poly)neuropathy: Secondary | ICD-10-CM | POA: Diagnosis present

## 2016-10-27 DIAGNOSIS — E43 Unspecified severe protein-calorie malnutrition: Secondary | ICD-10-CM | POA: Diagnosis present

## 2016-10-27 DIAGNOSIS — R52 Pain, unspecified: Secondary | ICD-10-CM | POA: Diagnosis not present

## 2016-10-27 DIAGNOSIS — M6281 Muscle weakness (generalized): Secondary | ICD-10-CM

## 2016-10-27 DIAGNOSIS — D638 Anemia in other chronic diseases classified elsewhere: Secondary | ICD-10-CM | POA: Diagnosis present

## 2016-10-27 DIAGNOSIS — E10649 Type 1 diabetes mellitus with hypoglycemia without coma: Secondary | ICD-10-CM | POA: Diagnosis present

## 2016-10-27 DIAGNOSIS — E785 Hyperlipidemia, unspecified: Secondary | ICD-10-CM | POA: Diagnosis present

## 2016-10-27 DIAGNOSIS — E1065 Type 1 diabetes mellitus with hyperglycemia: Secondary | ICD-10-CM | POA: Diagnosis present

## 2016-10-27 DIAGNOSIS — E8809 Other disorders of plasma-protein metabolism, not elsewhere classified: Secondary | ICD-10-CM | POA: Diagnosis not present

## 2016-10-27 DIAGNOSIS — Z23 Encounter for immunization: Secondary | ICD-10-CM

## 2016-10-27 DIAGNOSIS — R809 Proteinuria, unspecified: Secondary | ICD-10-CM | POA: Diagnosis present

## 2016-10-27 DIAGNOSIS — R0602 Shortness of breath: Secondary | ICD-10-CM | POA: Diagnosis not present

## 2016-10-27 DIAGNOSIS — K7291 Hepatic failure, unspecified with coma: Secondary | ICD-10-CM | POA: Diagnosis not present

## 2016-10-27 DIAGNOSIS — R2681 Unsteadiness on feet: Secondary | ICD-10-CM | POA: Diagnosis not present

## 2016-10-27 DIAGNOSIS — Z681 Body mass index (BMI) 19 or less, adult: Secondary | ICD-10-CM | POA: Diagnosis not present

## 2016-10-27 DIAGNOSIS — F17210 Nicotine dependence, cigarettes, uncomplicated: Secondary | ICD-10-CM | POA: Diagnosis present

## 2016-10-27 DIAGNOSIS — R531 Weakness: Secondary | ICD-10-CM

## 2016-10-27 DIAGNOSIS — Z8249 Family history of ischemic heart disease and other diseases of the circulatory system: Secondary | ICD-10-CM

## 2016-10-27 DIAGNOSIS — K769 Liver disease, unspecified: Secondary | ICD-10-CM | POA: Diagnosis not present

## 2016-10-27 DIAGNOSIS — E1165 Type 2 diabetes mellitus with hyperglycemia: Secondary | ICD-10-CM | POA: Diagnosis not present

## 2016-10-27 DIAGNOSIS — K219 Gastro-esophageal reflux disease without esophagitis: Secondary | ICD-10-CM | POA: Diagnosis present

## 2016-10-27 DIAGNOSIS — R251 Tremor, unspecified: Secondary | ICD-10-CM | POA: Diagnosis present

## 2016-10-27 DIAGNOSIS — E876 Hypokalemia: Secondary | ICD-10-CM | POA: Diagnosis not present

## 2016-10-27 DIAGNOSIS — K860 Alcohol-induced chronic pancreatitis: Secondary | ICD-10-CM | POA: Diagnosis present

## 2016-10-27 DIAGNOSIS — R200 Anesthesia of skin: Secondary | ICD-10-CM | POA: Diagnosis present

## 2016-10-27 DIAGNOSIS — N5089 Other specified disorders of the male genital organs: Secondary | ICD-10-CM | POA: Diagnosis present

## 2016-10-27 DIAGNOSIS — Z9049 Acquired absence of other specified parts of digestive tract: Secondary | ICD-10-CM

## 2016-10-27 DIAGNOSIS — IMO0002 Reserved for concepts with insufficient information to code with codable children: Secondary | ICD-10-CM | POA: Diagnosis present

## 2016-10-27 DIAGNOSIS — K861 Other chronic pancreatitis: Secondary | ICD-10-CM | POA: Diagnosis not present

## 2016-10-27 DIAGNOSIS — R278 Other lack of coordination: Secondary | ICD-10-CM | POA: Diagnosis not present

## 2016-10-27 DIAGNOSIS — N4889 Other specified disorders of penis: Secondary | ICD-10-CM | POA: Diagnosis present

## 2016-10-27 DIAGNOSIS — D649 Anemia, unspecified: Secondary | ICD-10-CM | POA: Diagnosis not present

## 2016-10-27 DIAGNOSIS — K7460 Unspecified cirrhosis of liver: Secondary | ICD-10-CM | POA: Diagnosis present

## 2016-10-27 DIAGNOSIS — G8929 Other chronic pain: Secondary | ICD-10-CM | POA: Diagnosis present

## 2016-10-27 DIAGNOSIS — Z833 Family history of diabetes mellitus: Secondary | ICD-10-CM

## 2016-10-27 DIAGNOSIS — E782 Mixed hyperlipidemia: Secondary | ICD-10-CM | POA: Diagnosis not present

## 2016-10-27 DIAGNOSIS — M79606 Pain in leg, unspecified: Secondary | ICD-10-CM | POA: Diagnosis not present

## 2016-10-27 DIAGNOSIS — R262 Difficulty in walking, not elsewhere classified: Secondary | ICD-10-CM | POA: Diagnosis present

## 2016-10-27 DIAGNOSIS — R1900 Intra-abdominal and pelvic swelling, mass and lump, unspecified site: Secondary | ICD-10-CM | POA: Diagnosis not present

## 2016-10-27 LAB — CBC WITH DIFFERENTIAL/PLATELET
Basophils Absolute: 0 10*3/uL (ref 0.0–0.1)
Basophils Relative: 0 %
Eosinophils Absolute: 0.1 10*3/uL (ref 0.0–0.7)
Eosinophils Relative: 1 %
HEMATOCRIT: 30.9 % — AB (ref 39.0–52.0)
HEMOGLOBIN: 10.4 g/dL — AB (ref 13.0–17.0)
LYMPHS ABS: 1.9 10*3/uL (ref 0.7–4.0)
LYMPHS PCT: 34 %
MCH: 26.7 pg (ref 26.0–34.0)
MCHC: 33.7 g/dL (ref 30.0–36.0)
MCV: 79.2 fL (ref 78.0–100.0)
MONO ABS: 0.3 10*3/uL (ref 0.1–1.0)
MONOS PCT: 6 %
NEUTROS ABS: 3.4 10*3/uL (ref 1.7–7.7)
NEUTROS PCT: 59 %
Platelets: 231 10*3/uL (ref 150–400)
RBC: 3.9 MIL/uL — ABNORMAL LOW (ref 4.22–5.81)
RDW: 16.1 % — AB (ref 11.5–15.5)
WBC: 5.7 10*3/uL (ref 4.0–10.5)

## 2016-10-27 LAB — GLUCOSE, CAPILLARY
GLUCOSE-CAPILLARY: 125 mg/dL — AB (ref 65–99)
GLUCOSE-CAPILLARY: 113 mg/dL — AB (ref 65–99)
GLUCOSE-CAPILLARY: 61 mg/dL — AB (ref 65–99)
GLUCOSE-CAPILLARY: 48 mg/dL — AB (ref 65–99)
GLUCOSE-CAPILLARY: 137 mg/dL — AB (ref 65–99)
Glucose-Capillary: 150 mg/dL — ABNORMAL HIGH (ref 65–99)
Glucose-Capillary: 143 mg/dL — ABNORMAL HIGH (ref 65–99)

## 2016-10-27 LAB — COMPREHENSIVE METABOLIC PANEL
ALK PHOS: 436 U/L — AB (ref 38–126)
ALT: 14 U/L — ABNORMAL LOW (ref 17–63)
ANION GAP: 7 (ref 5–15)
AST: 24 U/L (ref 15–41)
BUN: 19 mg/dL (ref 6–20)
CHLORIDE: 104 mmol/L (ref 101–111)
CO2: 28 mmol/L (ref 22–32)
Calcium: 7.5 mg/dL — ABNORMAL LOW (ref 8.9–10.3)
Creatinine, Ser: 1.14 mg/dL (ref 0.61–1.24)
GFR calc non Af Amer: >60 mL/min (ref >60)
GLUCOSE: 52 mg/dL — AB (ref 65–99)
Potassium: 2.5 mmol/L — CL (ref 3.5–5.1)
SODIUM: 139 mmol/L (ref 135–145)
Total Bilirubin: 0.3 mg/dL (ref 0.3–1.2)
Total Protein: 4.9 g/dL — ABNORMAL LOW (ref 6.5–8.1)

## 2016-10-27 LAB — URINALYSIS, ROUTINE W REFLEX MICROSCOPIC
BACTERIA UA: NONE SEEN
Bilirubin Urine: NEGATIVE
GLUCOSE, UA: NEGATIVE mg/dL
KETONES UR: NEGATIVE mg/dL
Leukocytes, UA: NEGATIVE
Nitrite: NEGATIVE
PROTEIN: 100 mg/dL — AB
Specific Gravity, Urine: 1.016 (ref 1.005–1.030)
pH: 5 (ref 5.0–8.0)

## 2016-10-27 LAB — CBG MONITORING, ED
Glucose-Capillary: 36 mg/dL — CL (ref 65–99)
Glucose-Capillary: 101 mg/dL — ABNORMAL HIGH (ref 65–99)
Glucose-Capillary: 55 mg/dL — ABNORMAL LOW (ref 65–99)

## 2016-10-27 LAB — TSH: TSH: 1.733 u[IU]/mL (ref 0.350–4.500)

## 2016-10-27 LAB — BRAIN NATRIURETIC PEPTIDE: B Natriuretic Peptide: 227 pg/mL — ABNORMAL HIGH (ref 0.0–100.0)

## 2016-10-27 LAB — MAGNESIUM
Magnesium: 1.6 mg/dL — ABNORMAL LOW (ref 1.7–2.4)
Magnesium: 1.6 mg/dL — ABNORMAL LOW (ref 1.7–2.4)

## 2016-10-27 LAB — TROPONIN I: Troponin I: 0.03 ng/mL (ref <0.03)

## 2016-10-27 LAB — VITAMIN B12: Vitamin B-12: 1311 pg/mL — ABNORMAL HIGH (ref 180–914)

## 2016-10-27 MED ORDER — SODIUM CHLORIDE 0.9% FLUSH
3.0000 mL | Freq: Two times a day (BID) | INTRAVENOUS | Status: DC
  Administered 2016-10-27 – 2016-11-03 (×13): 3 mL via INTRAVENOUS

## 2016-10-27 MED ORDER — CITALOPRAM HYDROBROMIDE 20 MG PO TABS
20.0000 mg | ORAL_TABLET | Freq: Every day | ORAL | Status: DC
  Administered 2016-10-27 – 2016-11-03 (×8): 20 mg via ORAL
  Filled 2016-10-27 (×7): qty 1

## 2016-10-27 MED ORDER — PANTOPRAZOLE SODIUM 40 MG PO TBEC
40.0000 mg | DELAYED_RELEASE_TABLET | Freq: Two times a day (BID) | ORAL | Status: DC
  Administered 2016-10-27 – 2016-11-03 (×14): 40 mg via ORAL
  Filled 2016-10-27 (×13): qty 1

## 2016-10-27 MED ORDER — POTASSIUM CHLORIDE CRYS ER 20 MEQ PO TBCR
40.0000 meq | EXTENDED_RELEASE_TABLET | ORAL | Status: AC
  Administered 2016-10-27 (×3): 40 meq via ORAL
  Filled 2016-10-27 (×3): qty 2

## 2016-10-27 MED ORDER — CILOSTAZOL 100 MG PO TABS
50.0000 mg | ORAL_TABLET | Freq: Two times a day (BID) | ORAL | Status: DC
  Administered 2016-10-27 – 2016-11-03 (×14): 50 mg via ORAL
  Filled 2016-10-27 (×2): qty 1
  Filled 2016-10-27: qty 0.5
  Filled 2016-10-27 (×3): qty 1
  Filled 2016-10-27: qty 0.5
  Filled 2016-10-27 (×2): qty 1
  Filled 2016-10-27: qty 0.5
  Filled 2016-10-27 (×8): qty 1
  Filled 2016-10-27: qty 0.5

## 2016-10-27 MED ORDER — ENOXAPARIN SODIUM 40 MG/0.4ML ~~LOC~~ SOLN
40.0000 mg | SUBCUTANEOUS | Status: DC
  Administered 2016-10-27 – 2016-11-02 (×7): 40 mg via SUBCUTANEOUS
  Filled 2016-10-27 (×7): qty 0.4

## 2016-10-27 MED ORDER — PANCRELIPASE (LIP-PROT-AMYL) 36000-114000 UNITS PO CPEP
36000.0000 [IU] | ORAL_CAPSULE | Freq: Three times a day (TID) | ORAL | Status: DC
  Administered 2016-10-27 – 2016-10-29 (×6): 36000 [IU] via ORAL
  Filled 2016-10-27 (×6): qty 1

## 2016-10-27 MED ORDER — SODIUM CHLORIDE 0.9% FLUSH
3.0000 mL | INTRAVENOUS | Status: DC | PRN
  Administered 2016-10-31: 3 mL via INTRAVENOUS
  Filled 2016-10-27: qty 3

## 2016-10-27 MED ORDER — INSULIN ASPART 100 UNIT/ML ~~LOC~~ SOLN
0.0000 [IU] | Freq: Three times a day (TID) | SUBCUTANEOUS | Status: DC
  Administered 2016-10-28: 2 [IU] via SUBCUTANEOUS
  Administered 2016-10-29 (×2): 3 [IU] via SUBCUTANEOUS
  Administered 2016-10-30: 2 [IU] via SUBCUTANEOUS
  Administered 2016-10-30: 3 [IU] via SUBCUTANEOUS
  Administered 2016-10-31: 2 [IU] via SUBCUTANEOUS
  Administered 2016-10-31: 8 [IU] via SUBCUTANEOUS
  Administered 2016-11-01: 2 [IU] via SUBCUTANEOUS
  Administered 2016-11-01: 3 [IU] via SUBCUTANEOUS
  Administered 2016-11-02: 5 [IU] via SUBCUTANEOUS
  Administered 2016-11-02: 15 [IU] via SUBCUTANEOUS
  Administered 2016-11-03: 2 [IU] via SUBCUTANEOUS

## 2016-10-27 MED ORDER — ONDANSETRON HCL 4 MG PO TABS: 4.0000 mg | ORAL_TABLET | Freq: Four times a day (QID) | ORAL | Status: DC | PRN

## 2016-10-27 MED ORDER — SODIUM CHLORIDE 0.9 % IV SOLN: 250.0000 mL | INTRAVENOUS | Status: DC | PRN

## 2016-10-27 MED ORDER — METOCLOPRAMIDE HCL 10 MG PO TABS
5.0000 mg | ORAL_TABLET | Freq: Four times a day (QID) | ORAL | Status: DC
  Administered 2016-10-27 – 2016-10-29 (×7): 5 mg via ORAL
  Filled 2016-10-27 (×7): qty 1

## 2016-10-27 MED ORDER — DEXTROSE 50 % IV SOLN
50.0000 mL | INTRAVENOUS | Status: AC
  Administered 2016-10-27: 50 mL via INTRAVENOUS

## 2016-10-27 MED ORDER — SIMVASTATIN 20 MG PO TABS
20.0000 mg | ORAL_TABLET | Freq: Every day | ORAL | Status: DC
  Administered 2016-10-27 – 2016-11-03 (×8): 20 mg via ORAL
  Filled 2016-10-27 (×8): qty 1

## 2016-10-27 MED ORDER — ONDANSETRON HCL 4 MG/2ML IJ SOLN: 4.0000 mg | Freq: Four times a day (QID) | INTRAMUSCULAR | Status: DC | PRN

## 2016-10-27 MED ORDER — ACETAMINOPHEN 325 MG PO TABS
650.0000 mg | ORAL_TABLET | Freq: Four times a day (QID) | ORAL | Status: DC | PRN
  Administered 2016-11-03: 650 mg via ORAL
  Filled 2016-10-27: qty 2

## 2016-10-27 MED ORDER — INSULIN ASPART 100 UNIT/ML ~~LOC~~ SOLN
0.0000 [IU] | Freq: Every day | SUBCUTANEOUS | Status: DC
  Administered 2016-10-30: 3 [IU] via SUBCUTANEOUS

## 2016-10-27 MED ORDER — OXYCODONE HCL 5 MG PO TABS
30.0000 mg | ORAL_TABLET | ORAL | Status: DC
  Administered 2016-10-27 – 2016-11-03 (×37): 30 mg via ORAL
  Filled 2016-10-27 (×38): qty 6

## 2016-10-27 MED ORDER — GEMFIBROZIL 600 MG PO TABS
600.0000 mg | ORAL_TABLET | Freq: Two times a day (BID) | ORAL | Status: DC
  Administered 2016-10-27 – 2016-11-03 (×15): 600 mg via ORAL
  Filled 2016-10-27 (×15): qty 1

## 2016-10-27 MED ORDER — LEVOTHYROXINE SODIUM 75 MCG PO TABS
75.0000 ug | ORAL_TABLET | Freq: Every day | ORAL | Status: DC
  Administered 2016-10-28 – 2016-11-03 (×7): 75 ug via ORAL
  Filled 2016-10-27 (×7): qty 1

## 2016-10-27 MED ORDER — FUROSEMIDE 10 MG/ML IJ SOLN
40.0000 mg | Freq: Two times a day (BID) | INTRAMUSCULAR | Status: DC
  Administered 2016-10-27 – 2016-10-29 (×5): 40 mg via INTRAVENOUS
  Filled 2016-10-27 (×5): qty 4

## 2016-10-27 MED ORDER — DEXTROSE 50 % IV SOLN
INTRAVENOUS | Status: AC
  Filled 2016-10-27: qty 50

## 2016-10-27 MED ORDER — GABAPENTIN 300 MG PO CAPS
600.0000 mg | ORAL_CAPSULE | Freq: Three times a day (TID) | ORAL | Status: DC
  Administered 2016-10-27 – 2016-11-03 (×21): 600 mg via ORAL
  Filled 2016-10-27 (×22): qty 2

## 2016-10-27 MED ORDER — POTASSIUM CHLORIDE CRYS ER 20 MEQ PO TBCR
40.0000 meq | EXTENDED_RELEASE_TABLET | Freq: Once | ORAL | Status: AC
  Administered 2016-10-27: 40 meq via ORAL
  Filled 2016-10-27: qty 2

## 2016-10-27 MED ORDER — TIZANIDINE HCL 4 MG PO TABS
4.0000 mg | ORAL_TABLET | Freq: Every day | ORAL | Status: DC
  Administered 2016-10-27 – 2016-11-02 (×7): 4 mg via ORAL
  Filled 2016-10-27 (×7): qty 1

## 2016-10-27 MED ORDER — BUPROPION HCL ER (XL) 300 MG PO TB24
300.0000 mg | ORAL_TABLET | Freq: Every day | ORAL | Status: DC
  Administered 2016-10-27 – 2016-11-03 (×8): 300 mg via ORAL
  Filled 2016-10-27 (×9): qty 1

## 2016-10-27 MED ORDER — ACETAMINOPHEN 650 MG RE SUPP: 650.0000 mg | Freq: Four times a day (QID) | RECTAL | Status: DC | PRN

## 2016-10-27 MED ORDER — URSODIOL 300 MG PO CAPS
600.0000 mg | ORAL_CAPSULE | Freq: Two times a day (BID) | ORAL | Status: DC
  Administered 2016-10-27 – 2016-11-03 (×14): 600 mg via ORAL
  Filled 2016-10-27 (×18): qty 2

## 2016-10-27 MED ORDER — PNEUMOCOCCAL VAC POLYVALENT 25 MCG/0.5ML IJ INJ
0.5000 mL | INJECTION | INTRAMUSCULAR | Status: AC
  Administered 2016-10-28: 0.5 mL via INTRAMUSCULAR
  Filled 2016-10-27: qty 0.5

## 2016-10-27 MED ORDER — RAMIPRIL 5 MG PO CAPS
5.0000 mg | ORAL_CAPSULE | Freq: Every day | ORAL | Status: DC
  Administered 2016-10-27 – 2016-11-03 (×8): 5 mg via ORAL
  Filled 2016-10-27 (×8): qty 1

## 2016-10-27 MED ORDER — TIZANIDINE HCL 4 MG PO TABS
2.0000 mg | ORAL_TABLET | Freq: Every day | ORAL | Status: DC
Start: 2016-10-28 — End: 2016-11-03
  Administered 2016-10-28 – 2016-11-03 (×7): 2 mg via ORAL
  Filled 2016-10-27 (×7): qty 1

## 2016-10-27 MED ORDER — MIRTAZAPINE 15 MG PO TABS
15.0000 mg | ORAL_TABLET | Freq: Every day | ORAL | Status: DC
  Administered 2016-10-27 – 2016-11-02 (×7): 15 mg via ORAL
  Filled 2016-10-27 (×7): qty 1

## 2016-10-27 MED ORDER — POTASSIUM CHLORIDE 10 MEQ/100ML IV SOLN
10.0000 meq | INTRAVENOUS | Status: AC
  Administered 2016-10-27 (×3): 10 meq via INTRAVENOUS
  Filled 2016-10-27 (×2): qty 100

## 2016-10-27 NOTE — ED Notes (Signed)
Pt given 8oz of juice and peanut butter crackers.
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
## 2016-10-27 NOTE — ED Notes (Signed)
Pt given graham crackers and peanut butter.

## 2016-10-27 NOTE — Progress Notes (Signed)
Inpatient Diabetes Program Recommendations  AACE/ADA: New Consensus Statement on Inpatient Glycemic Control (2015)  Target Ranges:  Prepandial:   less than 140 mg/dL      Peak postprandial:   less than 180 mg/dL (1-2 hours)      Critically ill patients:  140 - 180 mg/dL   Lab Results  Component Value Date   GLUCAP 113 (H) 10/27/2016   HGBA1C 11.8 (H) 08/19/2016    Review of Glycemic Control  Diabetes history: DM1 Outpatient Diabetes medications: Toujeo 20 units qd + Novolog 6 units tid + correction Current orders for Inpatient glycemic control: None  Inpatient Diabetes Program Recommendations:  Noted hypoglycemia on admission. Patient is type 1 so will need portion of basal insulin with added meal coverage when begins eating. Please consider: -Lantus 15 units daily -Novolog 4 units tid meal coverage when eating 50% meals -Novolog correction scale 2-6 units tid when CBGs increase Text page to Dr. Legrand Rams.  Thank you, Nani Gasser. Delonna Ney, RN, MSN, CDE Inpatient Glycemic Control Team Team Pager [phone] (8am-5pm) 10/27/2016 1:48 PM

## 2016-10-27 NOTE — ED Notes (Signed)
Patient on cardiac monitoring at this time. 

## 2016-10-27 NOTE — ED Notes (Signed)
CRITICAL VALUE ALERT  Critical value received:  Potassium 2.5, Troponin 0.03  Date of notification:  10/27/16  Time of notification:  0612  Critical value read back:Yes.    Nurse who received alert:  Derek Mound, RN  MD notified (1st page):  Tomi Bamberger  Time of first page:  [phone]  MD notified (2nd page):  Time of second page:  Responding MD:  Tomi Bamberger  Time MD responded:  [phone]
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
## 2016-10-27 NOTE — ED Triage Notes (Addendum)
Per EMS: Pt C/O of weakness that began 3 months ago. 2 months ago the pt began using a walker to get around and today he said he can not walk at all. Pt CBG 102. Pt also C/O swelling in his thighs and genitales.

## 2016-10-27 NOTE — Progress Notes (Signed)
Hypoglycemic Event  CBG: 48  Treatment: D50 IV 50 mL  Symptoms: None  Follow-up CBG: Time:1808 CBG Result:143  Possible Reasons for Event: Unknown  Comments/MD notified: Dr Roderic Palau verbally notified of complete event    Lynnda Shields

## 2016-10-27 NOTE — Progress Notes (Addendum)
Hypoglycemic Event  CBG: 61  Treatment: 15 GM carbohydrate snack and dinner  Symptoms: None  Follow-up CBG: Time:1743 CBG Result:48  Possible Reasons for Event: Unknown  Comments/MD notified:Memon verbally notified of complete event    Lynnda Shields

## 2016-10-28 ENCOUNTER — Inpatient Hospital Stay (HOSPITAL_COMMUNITY): Payer: Medicare Other

## 2016-10-28 LAB — COMPREHENSIVE METABOLIC PANEL
ALT: 12 U/L — ABNORMAL LOW (ref 17–63)
ANION GAP: 3 — AB (ref 5–15)
AST: 28 U/L (ref 15–41)
Albumin: <1 g/dL — ABNORMAL LOW (ref 3.5–5.0)
Alkaline Phosphatase: 378 U/L — ABNORMAL HIGH (ref 38–126)
BUN: 18 mg/dL (ref 6–20)
CHLORIDE: 109 mmol/L (ref 101–111)
CO2: 26 mmol/L (ref 22–32)
Calcium: 7.1 mg/dL — ABNORMAL LOW (ref 8.9–10.3)
Creatinine, Ser: 1.08 mg/dL (ref 0.61–1.24)
GFR calc Af Amer: >60 mL/min (ref >60)
GFR calc non Af Amer: >60 mL/min (ref >60)
Glucose, Bld: 56 mg/dL — ABNORMAL LOW (ref 65–99)
POTASSIUM: 5.3 mmol/L — AB (ref 3.5–5.1)
Sodium: 138 mmol/L (ref 135–145)
TOTAL PROTEIN: 4.2 g/dL — AB (ref 6.5–8.1)
Total Bilirubin: 0.4 mg/dL (ref 0.3–1.2)

## 2016-10-28 LAB — CBC
HEMATOCRIT: 28.4 % — AB (ref 39.0–52.0)
HEMOGLOBIN: 9.6 g/dL — AB (ref 13.0–17.0)
MCH: 27 pg (ref 26.0–34.0)
MCHC: 33.8 g/dL (ref 30.0–36.0)
MCV: 79.8 fL (ref 78.0–100.0)
Platelets: 203 10*3/uL (ref 150–400)
RBC: 3.56 MIL/uL — AB (ref 4.22–5.81)
RDW: 16.5 % — ABNORMAL HIGH (ref 11.5–15.5)
WBC: 8.3 10*3/uL (ref 4.0–10.5)

## 2016-10-28 LAB — GLUCOSE, CAPILLARY
GLUCOSE-CAPILLARY: 130 mg/dL — AB (ref 65–99)
GLUCOSE-CAPILLARY: 164 mg/dL — AB (ref 65–99)
Glucose-Capillary: 41 mg/dL — CL (ref 65–99)
Glucose-Capillary: 97 mg/dL (ref 65–99)
Glucose-Capillary: 121 mg/dL — ABNORMAL HIGH (ref 65–99)

## 2016-10-28 NOTE — Progress Notes (Signed)
Inpatient Diabetes Program Recommendations  AACE/ADA: New Consensus Statement on Inpatient Glycemic Control (2015)  Target Ranges:  Prepandial:   less than 140 mg/dL      Peak postprandial:   less than 180 mg/dL (1-2 hours)      Critically ill patients:  140 - 180 mg/dL   Lab Results  Component Value Date   GLUCAP 41 (LL) 10/28/2016   HGBA1C 11.8 (H) 08/19/2016    Review of Glycemic Control Results for KDEN, SEEBER (MRN [id number]) as of 10/28/2016 08:35  Ref. Range 10/27/2016 16:46 10/27/2016 17:34 10/27/2016 18:08 10/27/2016 20:46 10/28/2016 07:27  Glucose-Capillary Latest Ref Range: 65 - 99 mg/dL 61 (L) 48 (L) 143 (H) 137 (H) 41 (LL)   Inpatient Diabetes Program Recommendations:  Noted patient had recurrent hypoglycemia. No insulin has been given since admission. Sees Dr. Dorris Fetch as outpatient. Please consider inpatient consult to Dr. Dorris Fetch.  Thank you, Nani Gasser. Nassim Cosma, RN, MSN, CDE Inpatient Glycemic Control Team Team Pager [phone] (8am-5pm) 10/28/2016 8:38 AM

## 2016-10-29 DIAGNOSIS — R531 Weakness: Secondary | ICD-10-CM

## 2016-10-29 DIAGNOSIS — D649 Anemia, unspecified: Secondary | ICD-10-CM

## 2016-10-29 DIAGNOSIS — K769 Liver disease, unspecified: Secondary | ICD-10-CM

## 2016-10-29 DIAGNOSIS — E162 Hypoglycemia, unspecified: Secondary | ICD-10-CM

## 2016-10-29 DIAGNOSIS — E876 Hypokalemia: Secondary | ICD-10-CM

## 2016-10-29 DIAGNOSIS — K861 Other chronic pancreatitis: Secondary | ICD-10-CM

## 2016-10-29 LAB — GLUCOSE, CAPILLARY
GLUCOSE-CAPILLARY: 97 mg/dL (ref 65–99)
GLUCOSE-CAPILLARY: 163 mg/dL — AB (ref 65–99)
Glucose-Capillary: 191 mg/dL — ABNORMAL HIGH (ref 65–99)
Glucose-Capillary: 146 mg/dL — ABNORMAL HIGH (ref 65–99)

## 2016-10-29 LAB — PROTIME-INR
INR: 1.13
PROTHROMBIN TIME: 14.6 s (ref 11.4–15.2)

## 2016-10-29 LAB — ALBUMIN: Albumin: <1 g/dL — ABNORMAL LOW (ref 3.5–5.0)

## 2016-10-29 LAB — FERRITIN: Ferritin: 322 ng/mL (ref 24–336)

## 2016-10-29 LAB — AMMONIA: Ammonia: 50 umol/L — ABNORMAL HIGH (ref 9–35)

## 2016-10-29 MED ORDER — PANCRELIPASE (LIP-PROT-AMYL) 12000-38000 UNITS PO CPEP
48000.0000 [IU] | ORAL_CAPSULE | Freq: Three times a day (TID) | ORAL | Status: DC
  Administered 2016-10-29 – 2016-11-03 (×13): 48000 [IU] via ORAL
  Filled 2016-10-29 (×11): qty 4

## 2016-10-29 MED ORDER — PANCRELIPASE (LIP-PROT-AMYL) 12000-38000 UNITS PO CPEP
12000.0000 [IU] | ORAL_CAPSULE | Freq: Two times a day (BID) | ORAL | Status: DC
  Administered 2016-10-29 – 2016-11-03 (×6): 12000 [IU] via ORAL
  Filled 2016-10-29 (×5): qty 1

## 2016-10-29 NOTE — Progress Notes (Signed)
Subjective: Patient is more alert and awake. His blood sugar is improving. He is eating better. His abdominal ultrasound showed liver cirrhosis with moderate cirrhosis. His albumin level is less than 1. Objective: Vital signs in last 24 hours: Temp:  [98.3 F (36.8 C)-98.7 F (37.1 C)] 98.3 F (36.8 C) (01/26 0624) Pulse Rate:  [69-82] 69 (01/26 0624) Resp:  [15] 15 (01/26 0624) BP: (91-131)/(68-78) 131/77 (01/26 0624) SpO2:  [92 %-100 %] 96 % (01/26 0624) Weight change:  Last BM Date: 10/27/16  Intake/Output from previous day: 01/25 0701 - 01/26 0700 In: 603 [P.O.:600; I.V.:3] Out: 1950 [Urine:1950]  PHYSICAL EXAM General appearance: cachectic and fatigued Resp: clear to auscultation bilaterally Cardio: S1, S2 normal GI: soft, non-tender; bowel sounds normal; no masses,  no organomegaly Extremities: edema 3+++ LEG EDEMA  Lab Results:  Results for orders placed or performed during the hospital encounter of 10/27/16 (from the past 48 hour(s))  Glucose, capillary     Status: Abnormal   Collection Time: 10/27/16 10:44 AM  Result Value Ref Range   Glucose-Capillary 150 (H) 65 - 99 mg/dL   Comment 1 Notify RN   Glucose, capillary     Status: Abnormal   Collection Time: 10/27/16 12:11 PM  Result Value Ref Range   Glucose-Capillary 125 (H) 65 - 99 mg/dL   Comment 1 Notify RN    Comment 2 Document in Chart   Glucose, capillary     Status: Abnormal   Collection Time: 10/27/16 12:52 PM  Result Value Ref Range   Glucose-Capillary 113 (H) 65 - 99 mg/dL   Comment 1 Notify RN    Comment 2 Document in Chart   Urinalysis, Routine w reflex microscopic     Status: Abnormal   Collection Time: 10/27/16  2:50 PM  Result Value Ref Range   Color, Urine YELLOW YELLOW   APPearance CLEAR CLEAR   Specific Gravity, Urine 1.016 1.005 - 1.030   pH 5.0 5.0 - 8.0   Glucose, UA NEGATIVE NEGATIVE mg/dL   Hgb urine dipstick MODERATE (A) NEGATIVE   Bilirubin Urine NEGATIVE NEGATIVE   Ketones, ur  NEGATIVE NEGATIVE mg/dL   Protein, ur 100 (A) NEGATIVE mg/dL   Nitrite NEGATIVE NEGATIVE   Leukocytes, UA NEGATIVE NEGATIVE   RBC / HPF TOO NUMEROUS TO COUNT 0 - 5 RBC/hpf   WBC, UA 0-5 0 - 5 WBC/hpf   Bacteria, UA NONE SEEN NONE SEEN   Mucous PRESENT    Hyaline Casts, UA PRESENT   TSH     Status: None   Collection Time: 10/27/16  4:04 PM  Result Value Ref Range   TSH 1.733 0.350 - 4.500 uIU/mL    Comment: Performed by a 3rd Generation assay with a functional sensitivity of <=0.01 uIU/mL.  Vitamin B12     Status: Abnormal   Collection Time: 10/27/16  4:04 PM  Result Value Ref Range   Vitamin B-12 1,311 (H) 180 - 914 pg/mL    Comment: (NOTE) This assay is not validated for testing neonatal or myeloproliferative syndrome specimens for Vitamin B12 levels. Performed at [facility], [place] [address], [place], [place] [address]   Magnesium     Status: Abnormal   Collection Time: 10/27/16  4:04 PM  Result Value Ref Range   Magnesium 1.6 (L) 1.7 - 2.4 mg/dL  Glucose, capillary     Status: Abnormal   Collection Time: 10/27/16  4:46 PM  Result Value Ref Range   Glucose-Capillary 61 (L) 65 -  99 mg/dL   Comment 1 Notify RN    Comment 2 Document in Chart    Comment 3 Repeat Test   Glucose, capillary     Status: Abnormal   Collection Time: 10/27/16  5:34 PM  Result Value Ref Range   Glucose-Capillary 48 (L) 65 - 99 mg/dL   Comment 1 Notify RN    Comment 2 Document in Chart    Comment 3 Repeat Test   Glucose, capillary     Status: Abnormal   Collection Time: 10/27/16  6:08 PM  Result Value Ref Range   Glucose-Capillary 143 (H) 65 - 99 mg/dL   Comment 1 Notify RN    Comment 2 Document in Chart   Glucose, capillary     Status: Abnormal   Collection Time: 10/27/16  8:46 PM  Result Value Ref Range   Glucose-Capillary 137 (H) 65 - 99 mg/dL   Comment 1 Notify RN    Comment 2 Document in Chart   Comprehensive metabolic panel     Status: Abnormal   Collection Time: 10/28/16  5:49  AM  Result Value Ref Range   Sodium 138 135 - 145 mmol/L   Potassium 5.3 (H) 3.5 - 5.1 mmol/L    Comment: DELTA CHECK NOTED   Chloride 109 101 - 111 mmol/L   CO2 26 22 - 32 mmol/L   Glucose, Bld 56 (L) 65 - 99 mg/dL   BUN 18 6 - 20 mg/dL   Creatinine, Ser 1.08 0.61 - 1.24 mg/dL   Calcium 7.1 (L) 8.9 - 10.3 mg/dL   Total Protein 4.2 (L) 6.5 - 8.1 g/dL   Albumin <1.0 (L) 3.5 - 5.0 g/dL   AST 28 15 - 41 U/L   ALT 12 (L) 17 - 63 U/L   Alkaline Phosphatase 378 (H) 38 - 126 U/L   Total Bilirubin 0.4 0.3 - 1.2 mg/dL   GFR calc non Af Amer >60 >60 mL/min   GFR calc Af Amer >60 >60 mL/min    Comment: (NOTE) The eGFR has been calculated using the CKD EPI equation. This calculation has not been validated in all clinical situations. eGFR's persistently <60 mL/min signify possible Chronic Kidney Disease.    Anion gap 3 (L) 5 - 15  CBC     Status: Abnormal   Collection Time: 10/28/16  5:49 AM  Result Value Ref Range   WBC 8.3 4.0 - 10.5 K/uL   RBC 3.56 (L) 4.22 - 5.81 MIL/uL   Hemoglobin 9.6 (L) 13.0 - 17.0 g/dL   HCT 28.4 (L) 39.0 - 52.0 %   MCV 79.8 78.0 - 100.0 fL   MCH 27.0 26.0 - 34.0 pg   MCHC 33.8 30.0 - 36.0 g/dL   RDW 16.5 (H) 11.5 - 15.5 %   Platelets 203 150 - 400 K/uL  Glucose, capillary     Status: Abnormal   Collection Time: 10/28/16  7:27 AM  Result Value Ref Range   Glucose-Capillary 41 (LL) 65 - 99 mg/dL  Glucose, capillary     Status: None   Collection Time: 10/28/16  9:46 AM  Result Value Ref Range   Glucose-Capillary 97 65 - 99 mg/dL  Glucose, capillary     Status: Abnormal   Collection Time: 10/28/16 11:34 AM  Result Value Ref Range   Glucose-Capillary 130 (H) 65 - 99 mg/dL  Glucose, capillary     Status: Abnormal   Collection Time: 10/28/16  4:10 PM  Result Value Ref Range   Glucose-Capillary  121 (H) 65 - 99 mg/dL  Glucose, capillary     Status: Abnormal   Collection Time: 10/28/16  8:46 PM  Result Value Ref Range   Glucose-Capillary 164 (H) 65 - 99  mg/dL   Comment 1 Notify RN    Comment 2 Document in Chart   Glucose, capillary     Status: None   Collection Time: 10/29/16  7:39 AM  Result Value Ref Range   Glucose-Capillary 97 65 - 99 mg/dL    ABGS No results for input(s): PHART, PO2ART, TCO2, HCO3 in the last 72 hours.  Invalid input(s): PCO2 CULTURES No results found for this or any previous visit (from the past 240 hour(s)). Studies/Results: US Abdomen Complete  Result Date: 10/28/2016 CLINICAL DATA:  51 year old male. Chronic calcific pancreatitis. Pancreatic insufficiency. Prior cholecystectomy. Initial encounter. EXAM: ABDOMEN ULTRASOUND COMPLETE COMPARISON:  Abdomen ultrasound 09/03/2016. CT Abdomen and Pelvis 01/01/2011. FINDINGS: Gallbladder: Surgically absent Common bile duct: Probable pneumobilia, as seen on the 2012 CT. CBD size estimated at 2 mm. No intra or extrahepatic biliary ductal dilatation is evident. Liver: Nodular liver contour (image 35). Suggestion of generalized decreased liver volume. No discrete liver lesion. IVC: No abnormality visualized. Pancreas: Poorly visualized. Spleen: Normal splenic size and echogenicity.  4.5 cm in length. Right Kidney: Length: 10.7 cm. Increased cortical echogenicity (image 64). No hydronephrosis or right renal mass. There is a simple appearing 1.3 cm upper pole cyst. Left Kidney: Length: 11.1 cm. Not as well visualized as the right. No hydronephrosis or left renal mass. Abdominal aorta: Evidence of the Calcified aortic atherosclerosis seen by CT. No abdominal aortic aneurysm identified. Other findings: Moderate volume ascites throughout the abdomen. The free fluid is seen surrounding multiple bowel loops in the peritoneal cavity (image 104). IMPRESSION: 1. Nodular, Cirrhotic Liver with moderate volume Ascites. No discrete liver lesion. 2. Surgically absent gallbladder with chronic pneumobilia. No biliary ductal enlargement. 3. Chronic medical renal disease. 4. Chronic calcified aortic  atherosclerosis. Electronically Signed   By: Genevie Ann M.D.   On: 10/28/2016 09:08    Medications: I have reviewed the patient's current medications.  Assesment: 2 Active Problems:   Type 1 diabetes mellitus, uncontrolled (HCC)   Hyperlipidemia   Essential hypertension, benign   GERD   Primary hypothyroidism   Pancreatic insufficiency   Generalized weakness   Hypokalemia   Hypoalbuminemia   Anasarca   Hypoglycemia  liver cirrhosis with asscites   Plan:  Medications reviewed Will continue to  CMP GI consult Will continue insulin sliding scale Nutritional support     LOS: 2 days   [name] [name] 10/28/2016, 8:30 AM
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
## 2016-10-29 NOTE — Progress Notes (Signed)
Subjective: Patient was admitted last night due to generalized weakness and hypoglycemia. In the last 6 months his condition was rapidly declining. He has lost weight and developed generalized  Edema. He was also found to have severe hypokalemia and potassium supplemented  Objective: Vital signs in last 24 hours: Temp:  [98.3 F (36.8 C)-98.7 F (37.1 C)] 98.3 F (36.8 C) (01/26 0624) Pulse Rate:  [69-82] 69 (01/26 0624) Resp:  [15] 15 (01/26 0624) BP: (91-131)/(68-78) 131/77 (01/26 0624) SpO2:  [92 %-100 %] 96 % (01/26 0624) Weight change:  Last BM Date: 10/27/16  Intake/Output from previous day: 01/25 0701 - 01/26 0700 In: 603 [P.O.:600; I.V.:3] Out: 1950 [Urine:1950]  PHYSICAL EXAM General appearance: cachectic and fatigued Resp: clear to auscultation bilaterally Cardio: S1, S2 normal GI: soft, non-tender; bowel sounds normal; no masses,  no organomegaly Extremities: edema 3+++ LEG EDEMA  Lab Results:  Results for orders placed or performed during the hospital encounter of 10/27/16 (from the past 48 hour(s))  Glucose, capillary     Status: Abnormal   Collection Time: 10/27/16 10:44 AM  Result Value Ref Range   Glucose-Capillary 150 (H) 65 - 99 mg/dL   Comment 1 Notify RN   Glucose, capillary     Status: Abnormal   Collection Time: 10/27/16 12:11 PM  Result Value Ref Range   Glucose-Capillary 125 (H) 65 - 99 mg/dL   Comment 1 Notify RN    Comment 2 Document in Chart   Glucose, capillary     Status: Abnormal   Collection Time: 10/27/16 12:52 PM  Result Value Ref Range   Glucose-Capillary 113 (H) 65 - 99 mg/dL   Comment 1 Notify RN    Comment 2 Document in Chart   Urinalysis, Routine w reflex microscopic     Status: Abnormal   Collection Time: 10/27/16  2:50 PM  Result Value Ref Range   Color, Urine YELLOW YELLOW   APPearance CLEAR CLEAR   Specific Gravity, Urine 1.016 1.005 - 1.030   pH 5.0 5.0 - 8.0   Glucose, UA NEGATIVE NEGATIVE mg/dL   Hgb urine dipstick  MODERATE (A) NEGATIVE   Bilirubin Urine NEGATIVE NEGATIVE   Ketones, ur NEGATIVE NEGATIVE mg/dL   Protein, ur 100 (A) NEGATIVE mg/dL   Nitrite NEGATIVE NEGATIVE   Leukocytes, UA NEGATIVE NEGATIVE   RBC / HPF TOO NUMEROUS TO COUNT 0 - 5 RBC/hpf   WBC, UA 0-5 0 - 5 WBC/hpf   Bacteria, UA NONE SEEN NONE SEEN   Mucous PRESENT    Hyaline Casts, UA PRESENT   TSH     Status: None   Collection Time: 10/27/16  4:04 PM  Result Value Ref Range   TSH 1.733 0.350 - 4.500 uIU/mL    Comment: Performed by a 3rd Generation assay with a functional sensitivity of <=0.01 uIU/mL.  Vitamin B12     Status: Abnormal   Collection Time: 10/27/16  4:04 PM  Result Value Ref Range   Vitamin B-12 1,311 (H) 180 - 914 pg/mL    Comment: (NOTE) This assay is not validated for testing neonatal or myeloproliferative syndrome specimens for Vitamin B12 levels. Performed at [facility], [address] [place], [place] [address]   Magnesium     Status: Abnormal   Collection Time: 10/27/16  4:04 PM  Result Value Ref Range   Magnesium 1.6 (L) 1.7 - 2.4 mg/dL  Glucose, capillary     Status: Abnormal   Collection Time: 10/27/16  4:46 PM  Result Value Ref Range   Glucose-Capillary 61 (L) 65 - 99 mg/dL   Comment 1 Notify RN    Comment 2 Document in Chart    Comment 3 Repeat Test   Glucose, capillary     Status: Abnormal   Collection Time: 10/27/16  5:34 PM  Result Value Ref Range   Glucose-Capillary 48 (L) 65 - 99 mg/dL   Comment 1 Notify RN    Comment 2 Document in Chart    Comment 3 Repeat Test   Glucose, capillary     Status: Abnormal   Collection Time: 10/27/16  6:08 PM  Result Value Ref Range   Glucose-Capillary 143 (H) 65 - 99 mg/dL   Comment 1 Notify RN    Comment 2 Document in Chart   Glucose, capillary     Status: Abnormal   Collection Time: 10/27/16  8:46 PM  Result Value Ref Range   Glucose-Capillary 137 (H) 65 - 99 mg/dL   Comment 1 Notify RN    Comment 2 Document in Chart    Comprehensive metabolic panel     Status: Abnormal   Collection Time: 10/28/16  5:49 AM  Result Value Ref Range   Sodium 138 135 - 145 mmol/L   Potassium 5.3 (H) 3.5 - 5.1 mmol/L    Comment: DELTA CHECK NOTED   Chloride 109 101 - 111 mmol/L   CO2 26 22 - 32 mmol/L   Glucose, Bld 56 (L) 65 - 99 mg/dL   BUN 18 6 - 20 mg/dL   Creatinine, Ser 1.08 0.61 - 1.24 mg/dL   Calcium 7.1 (L) 8.9 - 10.3 mg/dL   Total Protein 4.2 (L) 6.5 - 8.1 g/dL   Albumin <1.0 (L) 3.5 - 5.0 g/dL   AST 28 15 - 41 U/L   ALT 12 (L) 17 - 63 U/L   Alkaline Phosphatase 378 (H) 38 - 126 U/L   Total Bilirubin 0.4 0.3 - 1.2 mg/dL   GFR calc non Af Amer >60 >60 mL/min   GFR calc Af Amer >60 >60 mL/min    Comment: (NOTE) The eGFR has been calculated using the CKD EPI equation. This calculation has not been validated in all clinical situations. eGFR's persistently <60 mL/min signify possible Chronic Kidney Disease.    Anion gap 3 (L) 5 - 15  CBC     Status: Abnormal   Collection Time: 10/28/16  5:49 AM  Result Value Ref Range   WBC 8.3 4.0 - 10.5 K/uL   RBC 3.56 (L) 4.22 - 5.81 MIL/uL   Hemoglobin 9.6 (L) 13.0 - 17.0 g/dL   HCT 28.4 (L) 39.0 - 52.0 %   MCV 79.8 78.0 - 100.0 fL   MCH 27.0 26.0 - 34.0 pg   MCHC 33.8 30.0 - 36.0 g/dL   RDW 16.5 (H) 11.5 - 15.5 %   Platelets 203 150 - 400 K/uL  Glucose, capillary     Status: Abnormal   Collection Time: 10/28/16  7:27 AM  Result Value Ref Range   Glucose-Capillary 41 (LL) 65 - 99 mg/dL  Glucose, capillary     Status: None   Collection Time: 10/28/16  9:46 AM  Result Value Ref Range   Glucose-Capillary 97 65 - 99 mg/dL  Glucose, capillary     Status: Abnormal   Collection Time: 10/28/16 11:34 AM  Result Value Ref Range   Glucose-Capillary 130 (H) 65 - 99 mg/dL  Glucose, capillary     Status: Abnormal   Collection Time: 10/28/16  4:10 PM  Result Value Ref Range   Glucose-Capillary 121 (H) 65 - 99 mg/dL  Glucose, capillary     Status: Abnormal   Collection  Time: 10/28/16  8:46 PM  Result Value Ref Range   Glucose-Capillary 164 (H) 65 - 99 mg/dL   Comment 1 Notify RN    Comment 2 Document in Chart   Glucose, capillary     Status: None   Collection Time: 10/29/16  7:39 AM  Result Value Ref Range   Glucose-Capillary 97 65 - 99 mg/dL    ABGS No results for input(s): PHART, PO2ART, TCO2, HCO3 in the last 72 hours.  Invalid input(s): PCO2 CULTURES No results found for this or any previous visit (from the past 240 hour(s)). Studies/Results: US Abdomen Complete  Result Date: 10/28/2016 CLINICAL DATA:  51 year old male. Chronic calcific pancreatitis. Pancreatic insufficiency. Prior cholecystectomy. Initial encounter. EXAM: ABDOMEN ULTRASOUND COMPLETE COMPARISON:  Abdomen ultrasound 09/03/2016. CT Abdomen and Pelvis 01/01/2011. FINDINGS: Gallbladder: Surgically absent Common bile duct: Probable pneumobilia, as seen on the 2012 CT. CBD size estimated at 2 mm. No intra or extrahepatic biliary ductal dilatation is evident. Liver: Nodular liver contour (image 35). Suggestion of generalized decreased liver volume. No discrete liver lesion. IVC: No abnormality visualized. Pancreas: Poorly visualized. Spleen: Normal splenic size and echogenicity.  4.5 cm in length. Right Kidney: Length: 10.7 cm. Increased cortical echogenicity (image 64). No hydronephrosis or right renal mass. There is a simple appearing 1.3 cm upper pole cyst. Left Kidney: Length: 11.1 cm. Not as well visualized as the right. No hydronephrosis or left renal mass. Abdominal aorta: Evidence of the Calcified aortic atherosclerosis seen by CT. No abdominal aortic aneurysm identified. Other findings: Moderate volume ascites throughout the abdomen. The free fluid is seen surrounding multiple bowel loops in the peritoneal cavity (image 104). IMPRESSION: 1. Nodular, Cirrhotic Liver with moderate volume Ascites. No discrete liver lesion. 2. Surgically absent gallbladder with chronic pneumobilia. No biliary  ductal enlargement. 3. Chronic medical renal disease. 4. Chronic calcified aortic atherosclerosis. Electronically Signed   By: Genevie Ann M.D.   On: 10/28/2016 09:08    Medications: I have reviewed the patient's current medications.  Assesment: 2 Active Problems:   Type 1 diabetes mellitus, uncontrolled (HCC)   Hyperlipidemia   Essential hypertension, benign   GERD   Primary hypothyroidism   Pancreatic insufficiency   Generalized weakness   Hypokalemia   Hypoalbuminemia   Anasarca   Hypoglycemia    Plan:  Medications reviewed Will continue to  BMP Will continue insulin sliding scale Nutritional support     LOS: 2 days   [name] [name] 10/28/2016, 7:53 AM

## 2016-10-30 DIAGNOSIS — D649 Anemia, unspecified: Secondary | ICD-10-CM

## 2016-10-30 DIAGNOSIS — E876 Hypokalemia: Secondary | ICD-10-CM

## 2016-10-30 DIAGNOSIS — E162 Hypoglycemia, unspecified: Secondary | ICD-10-CM

## 2016-10-30 DIAGNOSIS — R531 Weakness: Secondary | ICD-10-CM

## 2016-10-30 LAB — GLUCOSE, CAPILLARY
GLUCOSE-CAPILLARY: 71 mg/dL (ref 65–99)
GLUCOSE-CAPILLARY: 150 mg/dL — AB (ref 65–99)
GLUCOSE-CAPILLARY: 162 mg/dL — AB (ref 65–99)
GLUCOSE-CAPILLARY: 156 mg/dL — AB (ref 65–99)
GLUCOSE-CAPILLARY: 283 mg/dL — AB (ref 65–99)
Glucose-Capillary: 48 mg/dL — ABNORMAL LOW (ref 65–99)

## 2016-10-30 LAB — COMPREHENSIVE METABOLIC PANEL
ALT: 9 U/L — ABNORMAL LOW (ref 17–63)
ANION GAP: 4 — AB (ref 5–15)
AST: 18 U/L (ref 15–41)
Alkaline Phosphatase: 324 U/L — ABNORMAL HIGH (ref 38–126)
BILIRUBIN TOTAL: 0.2 mg/dL — AB (ref 0.3–1.2)
BUN: 23 mg/dL — AB (ref 6–20)
CHLORIDE: 108 mmol/L (ref 101–111)
CO2: 25 mmol/L (ref 22–32)
Calcium: 7.6 mg/dL — ABNORMAL LOW (ref 8.9–10.3)
Creatinine, Ser: 1.32 mg/dL — ABNORMAL HIGH (ref 0.61–1.24)
GFR calc Af Amer: >60 mL/min (ref >60)
GFR calc non Af Amer: >60 mL/min (ref >60)
GLUCOSE: 43 mg/dL — AB (ref 65–99)
POTASSIUM: 5 mmol/L (ref 3.5–5.1)
SODIUM: 137 mmol/L (ref 135–145)
TOTAL PROTEIN: 4.3 g/dL — AB (ref 6.5–8.1)

## 2016-10-30 MED ORDER — ALBUMIN HUMAN 25 % IV SOLN
25.0000 g | Freq: Once | INTRAVENOUS | Status: AC
  Administered 2016-10-30: 25 g via INTRAVENOUS
  Filled 2016-10-30: qty 100

## 2016-10-30 MED ORDER — FUROSEMIDE 10 MG/ML IJ SOLN
40.0000 mg | Freq: Once | INTRAMUSCULAR | Status: AC
  Administered 2016-10-30: 40 mg via INTRAVENOUS
  Filled 2016-10-30: qty 4

## 2016-10-30 MED ORDER — DEXTROSE 50 % IV SOLN
25.0000 mL | Freq: Once | INTRAVENOUS | Status: AC
  Administered 2016-10-30: 25 mL via INTRAVENOUS

## 2016-10-30 MED ORDER — DEXTROSE 50 % IV SOLN
INTRAVENOUS | Status: AC
  Filled 2016-10-30: qty 50

## 2016-10-30 NOTE — Progress Notes (Signed)
Subjective: He was admitted with profound weakness. He has anasarca. His albumin level is less than 1. He says he feels okay. No pain. He wants to get up in a chair. No nausea vomiting or diarrhea. His evaluation for cause of his very low albumin is underway  Objective: Vital signs in last 24 hours: Temp:  [98.5 F (36.9 C)-99 F (37.2 C)] 98.5 F (36.9 C) (01/27 0628) Pulse Rate:  [71-96] 71 (01/27 0628) Resp:  [18-20] 18 (01/27 0628) BP: (93-135)/(58-86) 93/65 (01/27 0628) SpO2:  [94 %-97 %] 97 % (01/27 0628) Weight change:  Last BM Date: 10/27/16  Intake/Output from previous day: 01/26 0701 - 01/27 0700 In: 240 [P.O.:240] Out: 3400 [Urine:3400]  PHYSICAL EXAM General appearance: alert, cooperative and Very thin Resp: clear to auscultation bilaterally Cardio: regular rate and rhythm, S1, S2 normal, no murmur, click, rub or gallop GI: soft, non-tender; bowel sounds normal; no masses,  no organomegaly Extremities: He still has bilateral edema of the legs Mucous membranes are moist. Skin warm and dry  Lab Results:  Results for orders placed or performed during the hospital encounter of 10/27/16 (from the past 48 hour(s))  Glucose, capillary     Status: None   Collection Time: 10/28/16  9:46 AM  Result Value Ref Range   Glucose-Capillary 97 65 - 99 mg/dL  Glucose, capillary     Status: Abnormal   Collection Time: 10/28/16 11:34 AM  Result Value Ref Range   Glucose-Capillary 130 (H) 65 - 99 mg/dL  Glucose, capillary     Status: Abnormal   Collection Time: 10/28/16  4:10 PM  Result Value Ref Range   Glucose-Capillary 121 (H) 65 - 99 mg/dL  Glucose, capillary     Status: Abnormal   Collection Time: 10/28/16  8:46 PM  Result Value Ref Range   Glucose-Capillary 164 (H) 65 - 99 mg/dL   Comment 1 Notify RN    Comment 2 Document in Chart   Glucose, capillary     Status: None   Collection Time: 10/29/16  7:39 AM  Result Value Ref Range   Glucose-Capillary 97 65 - 99 mg/dL   Glucose, capillary     Status: Abnormal   Collection Time: 10/29/16 11:36 AM  Result Value Ref Range   Glucose-Capillary 163 (H) 65 - 99 mg/dL  Albumin     Status: Abnormal   Collection Time: 10/29/16  3:52 PM  Result Value Ref Range   Albumin <1.0 (L) 3.5 - 5.0 g/dL  Protime-INR     Status: None   Collection Time: 10/29/16  3:52 PM  Result Value Ref Range   Prothrombin Time 14.6 11.4 - 15.2 seconds   INR 1.13   Ammonia     Status: Abnormal   Collection Time: 10/29/16  3:52 PM  Result Value Ref Range   Ammonia 50 (H) 9 - 35 umol/L  Ferritin     Status: None   Collection Time: 10/29/16  3:52 PM  Result Value Ref Range   Ferritin 322 24 - 336 ng/mL    Comment: Performed at [facility], [address] [place], [place] [address]  Glucose, capillary     Status: Abnormal   Collection Time: 10/29/16  4:43 PM  Result Value Ref Range   Glucose-Capillary 191 (H) 65 - 99 mg/dL  Glucose, capillary     Status: Abnormal   Collection Time: 10/29/16  9:55 PM  Result Value Ref Range   Glucose-Capillary 146 (H) 65 - 99 mg/dL  Comment 1 Notify RN    Comment 2 Document in Chart   Glucose, capillary     Status: Abnormal   Collection Time: 10/30/16  7:51 AM  Result Value Ref Range   Glucose-Capillary 48 (L) 65 - 99 mg/dL   Comment 1 Notify RN    Comment 2 Document in Chart     ABGS No results for input(s): PHART, PO2ART, TCO2, HCO3 in the last 72 hours.  Invalid input(s): PCO2 CULTURES No results found for this or any previous visit (from the past 240 hour(s)). Studies/Results: US Abdomen Complete  Result Date: 10/28/2016 CLINICAL DATA:  51 year old male. Chronic calcific pancreatitis. Pancreatic insufficiency. Prior cholecystectomy. Initial encounter. EXAM: ABDOMEN ULTRASOUND COMPLETE COMPARISON:  Abdomen ultrasound 09/03/2016. CT Abdomen and Pelvis 01/01/2011. FINDINGS: Gallbladder: Surgically absent Common bile duct: Probable pneumobilia, as seen on the 2012 CT. CBD size  estimated at 2 mm. No intra or extrahepatic biliary ductal dilatation is evident. Liver: Nodular liver contour (image 35). Suggestion of generalized decreased liver volume. No discrete liver lesion. IVC: No abnormality visualized. Pancreas: Poorly visualized. Spleen: Normal splenic size and echogenicity.  4.5 cm in length. Right Kidney: Length: 10.7 cm. Increased cortical echogenicity (image 64). No hydronephrosis or right renal mass. There is a simple appearing 1.3 cm upper pole cyst. Left Kidney: Length: 11.1 cm. Not as well visualized as the right. No hydronephrosis or left renal mass. Abdominal aorta: Evidence of the Calcified aortic atherosclerosis seen by CT. No abdominal aortic aneurysm identified. Other findings: Moderate volume ascites throughout the abdomen. The free fluid is seen surrounding multiple bowel loops in the peritoneal cavity (image 104). IMPRESSION: 1. Nodular, Cirrhotic Liver with moderate volume Ascites. No discrete liver lesion. 2. Surgically absent gallbladder with chronic pneumobilia. No biliary ductal enlargement. 3. Chronic medical renal disease. 4. Chronic calcified aortic atherosclerosis. Electronically Signed   By: Genevie Ann M.D.   On: 10/28/2016 09:08    Medications:  Prior to Admission:  Prescriptions Prior to Admission  Medication Sig Dispense Refill Last Dose  . albuterol (PROAIR HFA) 108 (90 Base) MCG/ACT inhaler Inhale 2 puffs into the lungs 4 (four) times daily.    10/26/2016 at Unknown time  . Aspirin-Salicylamide-Caffeine (BC HEADACHE POWDER PO) Take 1 packet by mouth daily as needed (for pain).    unknown  . buPROPion (WELLBUTRIN XL) 300 MG 24 hr tablet Take 300 mg by mouth daily.   10/26/2016 at Unknown time  . cilostazol (PLETAL) 50 MG tablet Take 50 mg by mouth 2 (two) times daily.   10/26/2016 at Unknown time  . citalopram (CELEXA) 20 MG tablet Take 20 mg by mouth daily.   10/26/2016 at Unknown time  . CREON 24000 units CPEP Take 24,000 Units by mouth 3 (three)  times daily.   10/26/2016 at Unknown time  . fish oil-omega-3 fatty acids 1000 MG capsule Take 1 g by mouth 2 (two) times daily.   10/26/2016 at Unknown time  . furosemide (LASIX) 40 MG tablet Take 20 mg by mouth 2 (two) times daily.    10/26/2016 at Unknown time  . gabapentin (NEURONTIN) 300 MG capsule Take 600 mg by mouth 3 (three) times daily.   10/26/2016 at Unknown time  . gemfibrozil (LOPID) 600 MG tablet Take 600 mg by mouth 2 (two) times daily.   10/26/2016 at Unknown time  . insulin aspart (NOVOLOG) 100 UNIT/ML injection Inject 6-12 Units into the skin 3 (three) times daily with meals.   10/26/2016 at Unknown time  . Insulin  Glargine (TOUJEO SOLOSTAR [place]) Inject 26 Units into the skin daily with breakfast.    10/26/2016 at Unknown time  . levothyroxine (SYNTHROID, LEVOTHROID) 75 MCG tablet TAKE 1 TABLET BY MOUTH EVERY MORNING. 30 tablet 2 10/26/2016 at Unknown time  . metoCLOPramide (REGLAN) 5 MG tablet Take 5 mg by mouth 4 (four) times daily.   10/26/2016 at Unknown time  . mirtazapine (REMERON) 15 MG tablet Take 1 tablet by mouth at bedtime.   10/26/2016 at Unknown time  . omeprazole (PRILOSEC) 20 MG capsule Take 20 mg by mouth 2 (two) times daily before a meal.    10/26/2016 at Unknown time  . oxycodone (ROXICODONE) 30 MG immediate release tablet Take 30 mg by mouth every 4 (four) hours.   10/27/2016 at 0800  . ramipril (ALTACE) 5 MG capsule Take 5 mg by mouth daily.   10/26/2016 at Unknown time  . simvastatin (ZOCOR) 20 MG tablet Take 20 mg by mouth daily.   10/26/2016 at Unknown time  . tiZANidine (ZANAFLEX) 2 MG tablet Take 2-4 mg by mouth 2 (two) times daily. **Take one tablet by mouth in the morning and two tablets in the evening**   10/26/2016 at Unknown time  . ursodiol (ACTIGALL) 500 MG tablet Take 500 mg by mouth 2 (two) times daily.   10/26/2016 at Unknown time  . Vitamin D, Ergocalciferol, (DRISDOL) 50000 units CAPS capsule TAKE ONE CAPSULE BY MOUTH ONCE WEEKLY. (Patient taking differently:  TAKE ONE CAPSULE BY MOUTH ONCE WEEKLY ON WEDNESDAYS) 4 capsule 0 Past Week at Unknown time  . glucose blood (ONE TOUCH TEST STRIPS) test strip Use as instructed 150 each 3 Taking  . glucose blood test strip Use as instructed 4 x daily. E11.65. EZ Max glucometer 150 each 5 Taking   Scheduled: . buPROPion  300 mg Oral Daily  . cilostazol  50 mg Oral BID  . citalopram  20 mg Oral Daily  . dextrose      . enoxaparin (LOVENOX) injection  40 mg Subcutaneous Q24H  . furosemide  40 mg Intravenous BID  . gabapentin  600 mg Oral TID  . gemfibrozil  600 mg Oral BID  . insulin aspart  0-15 Units Subcutaneous TID WC  . insulin aspart  0-5 Units Subcutaneous QHS  . levothyroxine  75 mcg Oral QAC breakfast  . lipase/protease/amylase  12,000 Units Oral BID BM  . lipase/protease/amylase  48,000 Units Oral TID  . mirtazapine  15 mg Oral QHS  . oxycodone  30 mg Oral Q4H  . pantoprazole  40 mg Oral BID AC  . ramipril  5 mg Oral Daily  . simvastatin  20 mg Oral Daily  . sodium chloride flush  3 mL Intravenous Q12H  . tiZANidine  2 mg Oral Daily  . tiZANidine  4 mg Oral QHS  . ursodiol  600 mg Oral BID   Continuous:  [id number] chloride, acetaminophen **OR** acetaminophen, ondansetron **OR** ondansetron (ZOFRAN) IV, sodium chloride flush  Assesment: He has multiple problems including diabetes which is generally not been controlled. He has hypothyroidism on replacement. This will continue. He has chronic pancreatic insufficiency and some malabsorption from that. He has low albumin which is very low and he is being worked up to see if this is primarily digestive or if he has nephrotic syndrome or some combination of both. He has anasarca. Active Problems:   Type 1 diabetes mellitus, uncontrolled (HCC)   Hyperlipidemia   Essential hypertension, benign   GERD   Primary hypothyroidism  Pancreatic insufficiency   Generalized weakness   Hypokalemia   Hypoalbuminemia   Anasarca    Hypoglycemia    Plan: Continue treatments. He wants to get up.    LOS: 3 days   Joleene Burnham L 10/30/2016, 8:14 AM

## 2016-10-30 NOTE — Progress Notes (Addendum)
No complaints. Tolerating diet. 24-hour urine collection in progress.   Vital signs in last 24 hours: Temp:  [98.5 F (36.9 C)-99 F (37.2 C)] 98.5 F (36.9 C) (01/27 0628) Pulse Rate:  [71-96] 71 (01/27 0628) Resp:  [18-20] 18 (01/27 0628) BP: (93-98)/(58-71) 93/65 (01/27 0628) SpO2:  [94 %-97 %] 97 % (01/27 0628) Last BM Date: 10/27/16 General:   Alert,  , pleasant and cooperative in NAD Abdomen:  Soft, nontender and nondistended.  Normal bowel sounds, without guarding, and without rebound.  No mass or organomegaly. Extremities:  Trace LE edema.    Intake/Output from previous day: 01/26 0701 - 01/27 0700 In: 240 [P.O.:240] Out: 3400 [Urine:3400] Intake/Output this shift: No intake/output data recorded.  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 Phosphatase in the 300 range. He remains on Urso. Albumin less than 1;  this degree of hypoalbuminemia likely not all attributable to cirrhosis.  24-hour urine collection in progress.  TSH okay  Anemia-stable. Ferritin okay. Likely multifactorial in origin  Elevated creatinine / BUN-mild.  May be related to diuretic therapy and/or low albumin.  Will decrease furosemide to 40 mg once daily. Will give 2 hours after albumin infusion complete.  Albumin to be started after 24-hour urine collection complete. Recheck BMET and serum magnesium tomorrow. Daily weights  Chronic calcific pancreatitis-Continue enzyme supplements/PPI  Further  recommendations to follow

## 2016-10-31 DIAGNOSIS — K861 Other chronic pancreatitis: Secondary | ICD-10-CM

## 2016-10-31 LAB — PROTEIN, URINE, 24 HOUR
COLLECTION INTERVAL-UPROT: 24 h
PROTEIN, 24H URINE: 546 mg/d — AB (ref 50–100)
PROTEIN, URINE: 21 mg/dL
URINE TOTAL VOLUME-UPROT: 2600 mL

## 2016-10-31 LAB — IRON AND TIBC: IRON: 34 ug/dL — AB (ref 45–182)

## 2016-10-31 LAB — MAGNESIUM: Magnesium: 1.9 mg/dL (ref 1.7–2.4)

## 2016-10-31 LAB — GLUCOSE, CAPILLARY
GLUCOSE-CAPILLARY: 61 mg/dL — AB (ref 65–99)
Glucose-Capillary: 129 mg/dL — ABNORMAL HIGH (ref 65–99)
Glucose-Capillary: 280 mg/dL — ABNORMAL HIGH (ref 65–99)
Glucose-Capillary: 99 mg/dL (ref 65–99)

## 2016-10-31 LAB — BASIC METABOLIC PANEL
Anion gap: 7 (ref 5–15)
BUN: 23 mg/dL — ABNORMAL HIGH (ref 6–20)
CALCIUM: 8 mg/dL — AB (ref 8.9–10.3)
CHLORIDE: 103 mmol/L (ref 101–111)
CO2: 28 mmol/L (ref 22–32)
Creatinine, Ser: 1.25 mg/dL — ABNORMAL HIGH (ref 0.61–1.24)
GFR calc non Af Amer: >60 mL/min (ref >60)
Glucose, Bld: 80 mg/dL (ref 65–99)
POTASSIUM: 4.6 mmol/L (ref 3.5–5.1)
Sodium: 138 mmol/L (ref 135–145)

## 2016-10-31 LAB — AFP TUMOR MARKER: AFP-Tumor Marker: 1.3 ng/mL (ref 0.0–8.3)

## 2016-10-31 NOTE — Progress Notes (Signed)
Subjective: He says he feels a little better. No new complaints. He did get up some yesterday. He is very weak. His urine for protein was elevated but not at a nephrotic level. No abdominal pain. No nausea or vomiting. His breathing is okay.  Objective: Vital signs in last 24 hours: Temp:  [98 F (36.7 C)-98.7 F (37.1 C)] 98.5 F (36.9 C) (01/28 0538) Pulse Rate:  [75-86] 86 (01/28 0538) Resp:  [16-18] 18 (01/28 0538) BP: (119-141)/(71-92) 134/86 (01/28 0538) SpO2:  [96 %-100 %] 100 % (01/28 0538) Weight:  [63.8 kg (140 lb 9.6 oz)] 63.8 kg (140 lb 9.6 oz) (01/28 0538) Weight change:  Last BM Date: 10/27/16  Intake/Output from previous day: 01/27 0701 - 01/28 0700 In: 960 [P.O.:960] Out: 2000 [Urine:2000]  PHYSICAL EXAM General appearance: alert, cooperative and mild distress Resp: clear to auscultation bilaterally Cardio: regular rate and rhythm, S1, S2 normal, no murmur, click, rub or gallop GI: soft, non-tender; bowel sounds normal; no masses,  no organomegaly Extremities: He has anasarca which is slightly better Skin warm and dry.  Lab Results:  Results for orders placed or performed during the hospital encounter of 10/27/16 (from the past 48 hour(s))  Glucose, capillary     Status: Abnormal   Collection Time: 10/29/16 11:36 AM  Result Value Ref Range   Glucose-Capillary 163 (H) 65 - 99 mg/dL  Albumin     Status: Abnormal   Collection Time: 10/29/16  3:52 PM  Result Value Ref Range   Albumin <1.0 (L) 3.5 - 5.0 g/dL  Protime-INR     Status: None   Collection Time: 10/29/16  3:52 PM  Result Value Ref Range   Prothrombin Time 14.6 11.4 - 15.2 seconds   INR 1.13   Ammonia     Status: Abnormal   Collection Time: 10/29/16  3:52 PM  Result Value Ref Range   Ammonia 50 (H) 9 - 35 umol/L  Ferritin     Status: None   Collection Time: 10/29/16  3:52 PM  Result Value Ref Range   Ferritin 322 24 - 336 ng/mL    Comment: Performed at [facility], [address] [place], [place] [address]  Glucose, capillary     Status: Abnormal   Collection Time: 10/29/16  4:43 PM  Result Value Ref Range   Glucose-Capillary 191 (H) 65 - 99 mg/dL  Glucose, capillary     Status: Abnormal   Collection Time: 10/29/16  9:55 PM  Result Value Ref Range   Glucose-Capillary 146 (H) 65 - 99 mg/dL   Comment 1 Notify RN    Comment 2 Document in Chart   Comprehensive metabolic panel     Status: Abnormal   Collection Time: 10/30/16  7:46 AM  Result Value Ref Range   Sodium 137 135 - 145 mmol/L   Potassium 5.0 3.5 - 5.1 mmol/L   Chloride 108 101 - 111 mmol/L   CO2 25 22 - 32 mmol/L   Glucose, Bld 43 (LL) 65 - 99 mg/dL    Comment: CRITICAL RESULT CALLED TO, READ BACK BY AND VERIFIED WITH: WATKINS,T AT 5361 ON 10/30/16 BY MOSLEY,J    BUN 23 (H) 6 - 20 mg/dL   Creatinine, Ser 1.32 (H) 0.61 - 1.24 mg/dL   Calcium 7.6 (L) 8.9 - 10.3 mg/dL   Total Protein 4.3 (L) 6.5 - 8.1 g/dL   Albumin <1.0 (L) 3.5 - 5.0 g/dL   AST 18 15 - 41 U/L   ALT  9 (L) 17 - 63 U/L   Alkaline Phosphatase 324 (H) 38 - 126 U/L   Total Bilirubin 0.2 (L) 0.3 - 1.2 mg/dL   GFR calc non Af Amer >60 >60 mL/min   GFR calc Af Amer >60 >60 mL/min    Comment: (NOTE) The eGFR has been calculated using the CKD EPI equation. This calculation has not been validated in all clinical situations. eGFR's persistently <60 mL/min signify possible Chronic Kidney Disease.    Anion gap 4 (L) 5 - 15  Iron and TIBC     Status: Abnormal   Collection Time: 10/30/16  7:46 AM  Result Value Ref Range   Iron 34 (L) 45 - 182 ug/dL   TIBC        250 - 450 ug/dL    Comment: RESULT BELOW REPORTABLE RANGE, UNABLE TO CALCULATE. TRANSFERRIN        RESULT BELOW REPORTABLE RANGE, UNABLE TO CALCULATE.    Saturation Ratios NOT DONE 17.9 - 39.5 %    Comment: TRANSFERRIN        RESULT BELOW REPORTABLE RANGE, UNABLE TO CALCULATE.    UIBC NOT DONE ug/dL    Comment: TRANSFERRIN        RESULT BELOW REPORTABLE RANGE, UNABLE TO  CALCULATE. Performed at [facility], [place] [address], [place], [place] [address]   Glucose, capillary     Status: Abnormal   Collection Time: 10/30/16  7:51 AM  Result Value Ref Range   Glucose-Capillary 48 (L) 65 - 99 mg/dL   Comment 1 Notify RN    Comment 2 Document in Chart   Glucose, capillary     Status: None   Collection Time: 10/30/16  9:17 AM  Result Value Ref Range   Glucose-Capillary 71 65 - 99 mg/dL   Comment 1 Notify RN    Comment 2 Document in Chart   Glucose, capillary     Status: Abnormal   Collection Time: 10/30/16 11:20 AM  Result Value Ref Range   Glucose-Capillary 150 (H) 65 - 99 mg/dL  Glucose, capillary     Status: Abnormal   Collection Time: 10/30/16  3:33 PM  Result Value Ref Range   Glucose-Capillary 162 (H) 65 - 99 mg/dL   Comment 1 Notify RN   Protein, urine, 24 hour     Status: Abnormal   Collection Time: 10/30/16  3:40 PM  Result Value Ref Range   Urine Total Volume-UPROT 2,600 mL    Comment: CORRECTED ON 01/28 AT 0037: PREVIOUSLY REPORTED AS 2600 ML   Collection Interval-UPROT 24 hours   Protein, Urine 21 mg/dL   Protein, 24H Urine 546 (H) 50 - 100 mg/day  Glucose, capillary     Status: Abnormal   Collection Time: 10/30/16  4:32 PM  Result Value Ref Range   Glucose-Capillary 156 (H) 65 - 99 mg/dL  Glucose, capillary     Status: Abnormal   Collection Time: 10/30/16  9:50 PM  Result Value Ref Range   Glucose-Capillary 283 (H) 65 - 99 mg/dL   Comment 1 Notify RN    Comment 2 Document in Chart   Magnesium     Status: None   Collection Time: 10/31/16  6:48 AM  Result Value Ref Range   Magnesium 1.9 1.7 - 2.4 mg/dL  Basic metabolic panel     Status: Abnormal   Collection Time: 10/31/16  6:48 AM  Result Value Ref Range   Sodium 138 135 - 145 mmol/L   Potassium 4.6  3.5 - 5.1 mmol/L   Chloride 103 101 - 111 mmol/L   CO2 28 22 - 32 mmol/L   Glucose, Bld 80 65 - 99 mg/dL   BUN 23 (H) 6 - 20 mg/dL   Creatinine, Ser 1.25 (H) 0.61 - 1.24  mg/dL   Calcium 8.0 (L) 8.9 - 10.3 mg/dL   GFR calc non Af Amer >60 >60 mL/min   GFR calc Af Amer >60 >60 mL/min    Comment: (NOTE) The eGFR has been calculated using the CKD EPI equation. This calculation has not been validated in all clinical situations. eGFR's persistently <60 mL/min signify possible Chronic Kidney Disease.    Anion gap 7 5 - 15  Glucose, capillary     Status: Abnormal   Collection Time: 10/31/16  7:25 AM  Result Value Ref Range   Glucose-Capillary 129 (H) 65 - 99 mg/dL   Comment 1 Notify RN   Glucose, capillary     Status: Abnormal   Collection Time: 10/31/16 11:12 AM  Result Value Ref Range   Glucose-Capillary 280 (H) 65 - 99 mg/dL   Comment 1 Notify RN     ABGS No results for input(s): PHART, PO2ART, TCO2, HCO3 in the last 72 hours.  Invalid input(s): PCO2 CULTURES No results found for this or any previous visit (from the past 240 hour(s)). Studies/Results: No results found.  Medications:  Prior to Admission:  Prescriptions Prior to Admission  Medication Sig Dispense Refill Last Dose  . albuterol (PROAIR HFA) 108 (90 Base) MCG/ACT inhaler Inhale 2 puffs into the lungs 4 (four) times daily.    10/26/2016 at Unknown time  . Aspirin-Salicylamide-Caffeine (BC HEADACHE POWDER PO) Take 1 packet by mouth daily as needed (for pain).    unknown  . buPROPion (WELLBUTRIN XL) 300 MG 24 hr tablet Take 300 mg by mouth daily.   10/26/2016 at Unknown time  . cilostazol (PLETAL) 50 MG tablet Take 50 mg by mouth 2 (two) times daily.   10/26/2016 at Unknown time  . citalopram (CELEXA) 20 MG tablet Take 20 mg by mouth daily.   10/26/2016 at Unknown time  . CREON 24000 units CPEP Take 24,000 Units by mouth 3 (three) times daily.   10/26/2016 at Unknown time  . fish oil-omega-3 fatty acids 1000 MG capsule Take 1 g by mouth 2 (two) times daily.   10/26/2016 at Unknown time  . furosemide (LASIX) 40 MG tablet Take 20 mg by mouth 2 (two) times daily.    10/26/2016 at Unknown time  .  gabapentin (NEURONTIN) 300 MG capsule Take 600 mg by mouth 3 (three) times daily.   10/26/2016 at Unknown time  . gemfibrozil (LOPID) 600 MG tablet Take 600 mg by mouth 2 (two) times daily.   10/26/2016 at Unknown time  . insulin aspart (NOVOLOG) 100 UNIT/ML injection Inject 6-12 Units into the skin 3 (three) times daily with meals.   10/26/2016 at Unknown time  . Insulin Glargine (TOUJEO SOLOSTAR Gratis) Inject 26 Units into the skin daily with breakfast.    10/26/2016 at Unknown time  . levothyroxine (SYNTHROID, LEVOTHROID) 75 MCG tablet TAKE 1 TABLET BY MOUTH EVERY MORNING. 30 tablet 2 10/26/2016 at Unknown time  . metoCLOPramide (REGLAN) 5 MG tablet Take 5 mg by mouth 4 (four) times daily.   10/26/2016 at Unknown time  . mirtazapine (REMERON) 15 MG tablet Take 1 tablet by mouth at bedtime.   10/26/2016 at Unknown time  . omeprazole (PRILOSEC) 20 MG capsule Take 20 mg  by mouth 2 (two) times daily before a meal.    10/26/2016 at Unknown time  . oxycodone (ROXICODONE) 30 MG immediate release tablet Take 30 mg by mouth every 4 (four) hours.   10/27/2016 at 0800  . ramipril (ALTACE) 5 MG capsule Take 5 mg by mouth daily.   10/26/2016 at Unknown time  . simvastatin (ZOCOR) 20 MG tablet Take 20 mg by mouth daily.   10/26/2016 at Unknown time  . tiZANidine (ZANAFLEX) 2 MG tablet Take 2-4 mg by mouth 2 (two) times daily. **Take one tablet by mouth in the morning and two tablets in the evening**   10/26/2016 at Unknown time  . ursodiol (ACTIGALL) 500 MG tablet Take 500 mg by mouth 2 (two) times daily.   10/26/2016 at Unknown time  . Vitamin D, Ergocalciferol, (DRISDOL) 50000 units CAPS capsule TAKE ONE CAPSULE BY MOUTH ONCE WEEKLY. (Patient taking differently: TAKE ONE CAPSULE BY MOUTH ONCE WEEKLY ON WEDNESDAYS) 4 capsule 0 Past Week at Unknown time  . glucose blood (ONE TOUCH TEST STRIPS) test strip Use as instructed 150 each 3 Taking  . glucose blood test strip Use as instructed 4 x daily. E11.65. EZ Max glucometer 150  each 5 Taking   Scheduled: . buPROPion  300 mg Oral Daily  . cilostazol  50 mg Oral BID  . citalopram  20 mg Oral Daily  . enoxaparin (LOVENOX) injection  40 mg Subcutaneous Q24H  . gabapentin  600 mg Oral TID  . gemfibrozil  600 mg Oral BID  . insulin aspart  0-15 Units Subcutaneous TID WC  . insulin aspart  0-5 Units Subcutaneous QHS  . levothyroxine  75 mcg Oral QAC breakfast  . lipase/protease/amylase  12,000 Units Oral BID BM  . lipase/protease/amylase  48,000 Units Oral TID  . mirtazapine  15 mg Oral QHS  . oxycodone  30 mg Oral Q4H  . pantoprazole  40 mg Oral BID AC  . ramipril  5 mg Oral Daily  . simvastatin  20 mg Oral Daily  . sodium chloride flush  3 mL Intravenous Q12H  . tiZANidine  2 mg Oral Daily  . tiZANidine  4 mg Oral QHS  . ursodiol  600 mg Oral BID   Continuous:  MWN:UUVOZD chloride, acetaminophen **OR** acetaminophen, ondansetron **OR** ondansetron (ZOFRAN) IV, sodium chloride flush  Assesment: He has profound hypoalbuminemia. He did have elevated protein in his urine. He has started on albumin replacement with Lasix. He has chronic pancreatic insufficiency and chronic malnutrition from that. Active Problems:   Type 1 diabetes mellitus, uncontrolled (HCC)   Hyperlipidemia   Essential hypertension, benign   GERD   Primary hypothyroidism   Pancreatic insufficiency   Generalized weakness   Hypokalemia   Hypoalbuminemia   Anasarca   Hypoglycemia    Plan: Continue current treatments.    LOS: 4 days   Boy Delamater L 10/31/2016, 11:24 AM

## 2016-10-31 NOTE — Consult Note (Signed)
Tolerating diet. No complaints.  140 pounds today;  reportedly 135 on admisiion   Vital signs in last 24 hours: Temp:  [98.5 F (36.9 C)-98.7 F (37.1 C)] 98.5 F (36.9 C) (01/28 0538) Pulse Rate:  [76-86] 86 (01/28 0538) Resp:  [18] 18 (01/28 0538) BP: (119-134)/(71-86) 134/86 (01/28 0538) SpO2:  [96 %-100 %] 100 % (01/28 0538) Weight:  [140 lb 9.6 oz (63.8 kg)] 140 lb 9.6 oz (63.8 kg) (01/28 0538) Last BM Date: 10/27/16 General:   Alert,  pleasant and cooperative in NAD Abdomen: Nondistended. Positive bowel sounds soft and nontender. Extremities:  Without clubbing or edema.    Intake/Output from previous day: 01/27 0701 - 01/28 0700 In: 960 [P.O.:960] Out: 2000 [Urine:2000] Intake/Output this shift: Total I/O In: 240 [P.O.:240] Out: -   Urine 24 hour protein : 546 mg   Lab Results: No results for input(s): WBC, HGB, HCT, PLT in the last 72 hours. BMET  Recent Labs  10/30/16 0746 10/31/16 0648  NA 137 138  K 5.0 4.6  CL 108 103  CO2 25 28  GLUCOSE 43* 80  BUN 23* 23*  CREATININE 1.32* 1.25*  CALCIUM 7.6* 8.0*   MG  1.9   LFT  Recent Labs  10/30/16 0746  PROT 4.3*  ALBUMIN <1.0*  AST 18  ALT 9*  ALKPHOS 324*  BILITOT 0.2*   PT/INR  Recent Labs  10/29/16 1552  LABPROT 14.6  INR 1.13    Impression:  Cirrhosis with anasarca.  Modest improvement in creatinine.  Proteinuria-but not in nephrotic range. Maybe starting to diurese. Not yet reflected in weights ? Accuracy .  Chronic pancreatitis-stable  Recommendations:  Continue current regimen including daily albumin followed by IV Lasix.

## 2016-11-01 DIAGNOSIS — K7291 Hepatic failure, unspecified with coma: Secondary | ICD-10-CM

## 2016-11-01 DIAGNOSIS — D649 Anemia, unspecified: Secondary | ICD-10-CM

## 2016-11-01 DIAGNOSIS — K769 Liver disease, unspecified: Secondary | ICD-10-CM

## 2016-11-01 DIAGNOSIS — R601 Generalized edema: Secondary | ICD-10-CM

## 2016-11-01 DIAGNOSIS — R269 Unspecified abnormalities of gait and mobility: Secondary | ICD-10-CM

## 2016-11-01 DIAGNOSIS — K861 Other chronic pancreatitis: Secondary | ICD-10-CM

## 2016-11-01 LAB — GLUCOSE, CAPILLARY
GLUCOSE-CAPILLARY: 164 mg/dL — AB (ref 65–99)
GLUCOSE-CAPILLARY: 174 mg/dL — AB (ref 65–99)
Glucose-Capillary: 125 mg/dL — ABNORMAL HIGH (ref 65–99)
Glucose-Capillary: 60 mg/dL — ABNORMAL LOW (ref 65–99)
Glucose-Capillary: 92 mg/dL (ref 65–99)

## 2016-11-01 LAB — ALBUMIN: Albumin: 1.3 g/dL — ABNORMAL LOW (ref 3.5–5.0)

## 2016-11-01 MED ORDER — FUROSEMIDE 40 MG PO TABS
40.0000 mg | ORAL_TABLET | Freq: Every day | ORAL | Status: DC
  Administered 2016-11-01 – 2016-11-03 (×3): 40 mg via ORAL
  Filled 2016-11-01 (×2): qty 1

## 2016-11-01 MED ORDER — POTASSIUM CHLORIDE CRYS ER 20 MEQ PO TBCR
20.0000 meq | EXTENDED_RELEASE_TABLET | Freq: Two times a day (BID) | ORAL | Status: DC
  Administered 2016-11-01 – 2016-11-03 (×5): 20 meq via ORAL
  Filled 2016-11-01 (×5): qty 1

## 2016-11-01 MED ORDER — LACTULOSE 10 GM/15ML PO SOLN
10.0000 g | Freq: Two times a day (BID) | ORAL | Status: DC
  Administered 2016-11-01 – 2016-11-03 (×5): 10 g via ORAL
  Filled 2016-11-01 (×5): qty 30

## 2016-11-01 MED ORDER — FERROUS SULFATE 325 (65 FE) MG PO TABS
325.0000 mg | ORAL_TABLET | Freq: Every day | ORAL | Status: DC
  Administered 2016-11-02 – 2016-11-03 (×2): 325 mg via ORAL
  Filled 2016-11-01: qty 1

## 2016-11-01 MED ORDER — ALBUMIN HUMAN 25 % IV SOLN
50.0000 g | Freq: Every day | INTRAVENOUS | Status: DC
  Administered 2016-11-01 – 2016-11-03 (×3): 50 g via INTRAVENOUS
  Filled 2016-11-01 (×4): qty 200

## 2016-11-01 NOTE — Progress Notes (Signed)
Subjective: Patient feels  Better. His po intake is improving. He has difficulty to ambulate and remained very weak. He is evaluated by GI and recommendations are made. Objective: Vital signs in last 24 hours: Temp:  [98.7 F (37.1 C)-99 F (37.2 C)] 98.7 F (37.1 C) (01/29 0500) Pulse Rate:  [80-88] 86 (01/29 0500) Resp:  [16-18] 18 (01/29 0500) BP: (100-119)/(65-73) 109/71 (01/29 0500) SpO2:  [97 %-100 %] 98 % (01/29 0500) Weight:  [58.1 kg (128 lb 1.6 oz)] 58.1 kg (128 lb 1.6 oz) (01/29 0500) Weight change: -5.67 kg (-12 lb 8 oz) Last BM Date: 10/27/16  Intake/Output from previous day: 01/28 0701 - 01/29 0700 In: 480 [P.O.:480] Out: 1300 [Urine:1300]  PHYSICAL EXAM General appearance: cachectic and fatigued Resp: clear to auscultation bilaterally Cardio: S1, S2 normal GI: soft, non-tender; bowel sounds normal; no masses,  no organomegaly Extremities: edema 3+++ LEG EDEMA  Lab Results:  Results for orders placed or performed during the hospital encounter of 10/27/16 (from the past 48 hour(s))  Glucose, capillary     Status: None   Collection Time: 10/30/16  9:17 AM  Result Value Ref Range   Glucose-Capillary 71 65 - 99 mg/dL   Comment 1 Notify RN    Comment 2 Document in Chart   Glucose, capillary     Status: Abnormal   Collection Time: 10/30/16 11:20 AM  Result Value Ref Range   Glucose-Capillary 150 (H) 65 - 99 mg/dL  Glucose, capillary     Status: Abnormal   Collection Time: 10/30/16  3:33 PM  Result Value Ref Range   Glucose-Capillary 162 (H) 65 - 99 mg/dL   Comment 1 Notify RN   Protein, urine, 24 hour     Status: Abnormal   Collection Time: 10/30/16  3:40 PM  Result Value Ref Range   Urine Total Volume-UPROT 2,600 mL    Comment: CORRECTED ON 01/28 AT 0037: PREVIOUSLY REPORTED AS 2600 ML   Collection Interval-UPROT 24 hours   Protein, Urine 21 mg/dL   Protein, 24H Urine 546 (H) 50 - 100 mg/day  Glucose, capillary     Status: Abnormal   Collection Time:  10/30/16  4:32 PM  Result Value Ref Range   Glucose-Capillary 156 (H) 65 - 99 mg/dL  Glucose, capillary     Status: Abnormal   Collection Time: 10/30/16  9:50 PM  Result Value Ref Range   Glucose-Capillary 283 (H) 65 - 99 mg/dL   Comment 1 Notify RN    Comment 2 Document in Chart   Magnesium     Status: None   Collection Time: 10/31/16  6:48 AM  Result Value Ref Range   Magnesium 1.9 1.7 - 2.4 mg/dL  Basic metabolic panel     Status: Abnormal   Collection Time: 10/31/16  6:48 AM  Result Value Ref Range   Sodium 138 135 - 145 mmol/L   Potassium 4.6 3.5 - 5.1 mmol/L   Chloride 103 101 - 111 mmol/L   CO2 28 22 - 32 mmol/L   Glucose, Bld 80 65 - 99 mg/dL   BUN 23 (H) 6 - 20 mg/dL   Creatinine, Ser 1.25 (H) 0.61 - 1.24 mg/dL   Calcium 8.0 (L) 8.9 - 10.3 mg/dL   GFR calc non Af Amer >60 >60 mL/min   GFR calc Af Amer >60 >60 mL/min    Comment: (NOTE) The eGFR has been calculated using the CKD EPI equation. This calculation has not been validated in all clinical situations. eGFR's  persistently <60 mL/min signify possible Chronic Kidney Disease.    Anion gap 7 5 - 15  Glucose, capillary     Status: Abnormal   Collection Time: 10/31/16  7:25 AM  Result Value Ref Range   Glucose-Capillary 129 (H) 65 - 99 mg/dL   Comment 1 Notify RN   Glucose, capillary     Status: Abnormal   Collection Time: 10/31/16 11:12 AM  Result Value Ref Range   Glucose-Capillary 280 (H) 65 - 99 mg/dL   Comment 1 Notify RN   Glucose, capillary     Status: Abnormal   Collection Time: 10/31/16  4:15 PM  Result Value Ref Range   Glucose-Capillary 61 (L) 65 - 99 mg/dL   Comment 1 Notify RN   Glucose, capillary     Status: None   Collection Time: 10/31/16  9:02 PM  Result Value Ref Range   Glucose-Capillary 99 65 - 99 mg/dL  Glucose, capillary     Status: Abnormal   Collection Time: 11/01/16  8:14 AM  Result Value Ref Range   Glucose-Capillary 125 (H) 65 - 99 mg/dL    ABGS No results for input(s):  PHART, PO2ART, TCO2, HCO3 in the last 72 hours.  Invalid input(s): PCO2 CULTURES No results found for this or any previous visit (from the past 240 hour(s)). Studies/Results: No results found.  Medications: I have reviewed the patient's current medications.  Assesment: 2 Active Problems:   Type 1 diabetes mellitus, uncontrolled (HCC)   Hyperlipidemia   Essential hypertension, benign   GERD   Primary hypothyroidism   Pancreatic insufficiency   Generalized weakness   Hypokalemia   Hypoalbuminemia   Anasarca   Hypoglycemia  liver cirrhosis with asscites   Plan:  Medications reviewed Will continue to  [facility] GI consult appreciated Physical therapy Social worker for placement Will start lasix Will continue insulin sliding scale Nutritional support     LOS: 5 days   Edmar Blankenburg 10/28/2016, 8:18 AM

## 2016-11-01 NOTE — Progress Notes (Signed)
  Subjective:  Patient states his appetite has improved. His bowels are moving. He is using bedside commode with assistance. He denies abdominal distention.  Objective: Blood pressure 109/71, pulse 86, temperature 98.7 F (37.1 C), temperature source Oral, resp. rate 18, height 5\' 6"  (1.676 m), weight 128 lb 1.6 oz (58.1 kg), SpO2 98 %. Patient is alert and in no acute distress. He has tremors involving both hands as well as asterixis. Abdomen is full but soft and nontender. Scrotal and penile edema noted. Bilateral 1 to 2+ pitting edema involving both legs and thighs.  Labs/studies Results:    Recent Labs  2016/11/02 0746 10/31/16 0648  NA 137 138  K 5.0 4.6  CL 108 103  CO2 25 28  GLUCOSE 43* 80  BUN 23* 23*  CREATININE 1.32* 1.25*  CALCIUM 7.6* 8.0*    LFT   Recent Labs  10/29/16 1552 2016/11/02 0746  PROT  --  4.3*  ALBUMIN <1.0* <1.0*  AST  --  18  ALT  --  9*  ALKPHOS  --  324*  BILITOT  --  0.2*    PT/INR   Recent Labs  10/29/16 1552  LABPROT 14.6  INR 1.13    Serum ammonia 50 three days ago. Serum iron 34; TIBC and saturation not done.  Assessment:  #1. Advanced cirrhosis with ascites and anasarca. Penile and scrotal edema has increased. He only received single dose of IV albumin 3 days ago. He will benefit from another couple of doses of IV albumin. 24-hour urinary protein 546 mg and not consistent with nephrotic syndrome as noted by Dr. Gala Romney as well. MELD score is 10. He appears much sicker than his meld score would indicate. #2. Chronic pancreatitis with pancreatic insufficiency. He is on pancreatic enzyme supplement. By mouth intake appears to be satisfactory. #3. Hepatic encephalopathy. Start patient on low-dose lactulose. #4. Anemia. Serum iron is low but serum ferritin normal. Suspect anemia is due to chronic disease. However will treated with by mouth iron just in case. #5. Falling episodes in gait disorder. Suspect multifactorial etiology. Dr.  Legrand Rams has requested PT consultation.  Recommendations:  Albumin 50 g IV daily for 3 doses. Ferrous sulfate 325 mg by mouth daily. Lactulose 10 g by mouth twice a day.

## 2016-11-01 NOTE — Progress Notes (Signed)
Pt sitting in bed BS 60.  Pt is asymptomatic. 4oz juice given with gram crackers and peanut butter.

## 2016-11-02 LAB — GLUCOSE, CAPILLARY
GLUCOSE-CAPILLARY: 353 mg/dL — AB (ref 65–99)
GLUCOSE-CAPILLARY: 242 mg/dL — AB (ref 65–99)
GLUCOSE-CAPILLARY: 140 mg/dL — AB (ref 65–99)
Glucose-Capillary: 57 mg/dL — ABNORMAL LOW (ref 65–99)
Glucose-Capillary: 62 mg/dL — ABNORMAL LOW (ref 65–99)
Glucose-Capillary: 97 mg/dL (ref 65–99)

## 2016-11-02 LAB — COMPREHENSIVE METABOLIC PANEL
ALK PHOS: 205 U/L — AB (ref 38–126)
ALT: 7 U/L — AB (ref 17–63)
ANION GAP: 8 (ref 5–15)
AST: 15 U/L (ref 15–41)
Albumin: 1.7 g/dL — ABNORMAL LOW (ref 3.5–5.0)
BILIRUBIN TOTAL: 0.3 mg/dL (ref 0.3–1.2)
BUN: 20 mg/dL (ref 6–20)
CO2: 23 mmol/L (ref 22–32)
CREATININE: 1.2 mg/dL (ref 0.61–1.24)
Calcium: 7.4 mg/dL — ABNORMAL LOW (ref 8.9–10.3)
Chloride: 107 mmol/L (ref 101–111)
GFR calc Af Amer: >60 mL/min (ref >60)
Glucose, Bld: 325 mg/dL — ABNORMAL HIGH (ref 65–99)
Potassium: 5 mmol/L (ref 3.5–5.1)
Sodium: 138 mmol/L (ref 135–145)
TOTAL PROTEIN: 4.1 g/dL — AB (ref 6.5–8.1)

## 2016-11-02 MED ORDER — INSULIN GLARGINE 100 UNIT/ML ~~LOC~~ SOLN
10.0000 [IU] | Freq: Every day | SUBCUTANEOUS | Status: DC
  Administered 2016-11-02: 10 [IU] via SUBCUTANEOUS
  Filled 2016-11-02 (×2): qty 0.1

## 2016-11-02 NOTE — [place] FL2 (Signed)
[place] MEDICAID FL2 LEVEL OF CARE SCREENING TOOL     IDENTIFICATION  Patient Name: [name] [name] Birthdate: 04-05-1966 Sex: male Admission Date (Current Location): 10/27/2016  [facility] and [place] Number:  [employer] and Address:  [place] [address], [place]      Provider Number: [id number]  Attending Physician Name and Address:  Rosita Fire, MD  Relative Name and Phone Number:       Current Level of Care: Hospital Recommended Level of Care: [place] Prior Approval Number:    Date Approved/Denied:   PASRR Number: [id number]  Discharge Plan: SNF    Current Diagnoses: Patient Active Problem List   Diagnosis Date Noted  . Generalized weakness 10/27/2016  . Hypokalemia 10/27/2016  . Hypoalbuminemia 10/27/2016  . Anasarca 10/27/2016  . Hypoglycemia 10/27/2016  . Personal history of noncompliance with medical treatment, presenting hazards to health 09/08/2016  . Primary hypothyroidism 07/30/2015  . Pancreatic insufficiency 07/30/2015  . Type 1 diabetes mellitus, uncontrolled ([place]) 01/26/2008  . Hyperlipidemia 01/26/2008  . DEPRESSION/ANXIETY 01/26/2008  . NICOTINE ADDICTION 01/26/2008  . Essential hypertension, benign 01/26/2008  . GERD 01/26/2008  . BACK PAIN, CHRONIC 01/26/2008  . ABDOMINAL PAIN, CHRONIC 01/26/2008    Orientation RESPIRATION BLADDER Height & Weight     Self, Time, Situation, Place  Normal Continent Weight: 123 lb 4.8 oz (55.9 kg) Height:  5\' 6"  (167.6 cm)  BEHAVIORAL SYMPTOMS/MOOD NEUROLOGICAL BOWEL NUTRITION STATUS  Other (Comment) (none)  (n/a) Continent Diet (heart healthy/carb modified)  AMBULATORY STATUS COMMUNICATION OF NEEDS Skin   Total Care Verbally Other (Comment) (Skin tear bilateral legs)                       Personal Care Assistance Level of Assistance  Bathing, Feeding, Dressing Bathing Assistance: Maximum assistance Feeding assistance: Limited  assistance Dressing Assistance: Maximum assistance     Functional Limitations Info  Sight, Hearing, Speech Sight Info: Adequate Hearing Info: Adequate Speech Info: Adequate    SPECIAL CARE FACTORS FREQUENCY  PT (By licensed PT)     PT Frequency: 5              Contractures Contractures Info: Not present    Additional Factors Info  Insulin Sliding Scale Code Status Info: Full code Allergies Info: No known allergies Psychotropic Info: Wellbutrin, Celexa, Remeron         Current Medications (11/02/2016):  This is the current hospital active medication list Current Facility-Administered Medications  Medication Dose Route Frequency Provider Last Rate Last Dose  . 0.9 %  sodium chloride infusion  250 mL Intravenous PRN Kathie Dike, MD      . acetaminophen (TYLENOL) tablet 650 mg  650 mg Oral Q6H PRN Kathie Dike, MD       Or  . acetaminophen (TYLENOL) suppository 650 mg  650 mg Rectal Q6H PRN Kathie Dike, MD      . albumin human 25 % solution 50 g  50 g Intravenous Daily Rogene Houston, MD   50 g at 11/02/16 1009  . buPROPion (WELLBUTRIN XL) 24 hr tablet 300 mg  300 mg Oral Daily Kathie Dike, MD   300 mg at 11/02/16 1013  . cilostazol (PLETAL) tablet 50 mg  50 mg Oral BID Rosita Fire, MD   50 mg at 11/02/16 1011  . citalopram (CELEXA) tablet 20 mg  20 mg Oral Daily Kathie Dike, MD   20 mg at 11/02/16 1013  .  enoxaparin (LOVENOX) injection 40 mg  40 mg Subcutaneous Q24H Kathie Dike, MD   40 mg at 11/01/16 1556  . ferrous sulfate tablet 325 mg  325 mg Oral Q breakfast Rogene Houston, MD   325 mg at 11/02/16 0810  . furosemide (LASIX) tablet 40 mg  40 mg Oral Daily Rosita Fire, MD   40 mg at 11/02/16 1013  . gabapentin (NEURONTIN) capsule 600 mg  600 mg Oral TID Kathie Dike, MD   600 mg at 11/02/16 1012  . gemfibrozil (LOPID) tablet 600 mg  600 mg Oral BID Kathie Dike, MD   600 mg at 11/02/16 1013  . insulin aspart (novoLOG) injection 0-15 Units   0-15 Units Subcutaneous TID WC Kathie Dike, MD   5 Units at 11/02/16 1203  . insulin aspart (novoLOG) injection 0-5 Units  0-5 Units Subcutaneous QHS Kathie Dike, MD   3 Units at 10/30/16 2230  . insulin glargine (LANTUS) injection 10 Units  10 Units Subcutaneous QHS Rosita Fire, MD      . lactulose (CHRONULAC) 10 GM/15ML solution 10 g  10 g Oral BID Rogene Houston, MD   10 g at 11/02/16 1016  . levothyroxine (SYNTHROID, LEVOTHROID) tablet 75 mcg  75 mcg Oral QAC breakfast Kathie Dike, MD   75 mcg at 11/02/16 0810  . lipase/protease/amylase (CREON) capsule 12,000 Units  12,000 Units Oral BID BM Rogene Houston, MD   12,000 Units at 11/02/16 1013  . lipase/protease/amylase (CREON) capsule 48,000 Units  48,000 Units Oral TID Rogene Houston, MD   48,000 Units at 11/02/16 1203  . mirtazapine (REMERON) tablet 15 mg  15 mg Oral QHS Kathie Dike, MD   15 mg at 11/01/16 2249  . ondansetron (ZOFRAN) tablet 4 mg  4 mg Oral Q6H PRN Kathie Dike, MD       Or  . ondansetron (ZOFRAN) injection 4 mg  4 mg Intravenous Q6H PRN Kathie Dike, MD      . oxyCODONE (Oxy IR/ROXICODONE) immediate release tablet 30 mg  30 mg Oral Q4H Kathie Dike, MD   30 mg at 11/02/16 1203  . pantoprazole (PROTONIX) EC tablet 40 mg  40 mg Oral BID AC Kathie Dike, MD   40 mg at 11/02/16 0810  . potassium chloride SA (K-DUR,KLOR-CON) CR tablet 20 mEq  20 mEq Oral BID Rosita Fire, MD   20 mEq at 11/02/16 1012  . ramipril (ALTACE) capsule 5 mg  5 mg Oral Daily Kathie Dike, MD   5 mg at 11/02/16 1013  . simvastatin (ZOCOR) tablet 20 mg  20 mg Oral Daily Kathie Dike, MD   20 mg at 11/02/16 1013  . sodium chloride flush (NS) 0.9 % injection 3 mL  3 mL Intravenous Q12H Kathie Dike, MD   3 mL at 11/02/16 1016  . sodium chloride flush (NS) 0.9 % injection 3 mL  3 mL Intravenous PRN Kathie Dike, MD   3 mL at 10/31/16 1133  . tiZANidine (ZANAFLEX) tablet 2 mg  2 mg Oral Daily Kathie Dike, MD   2 mg at  11/02/16 1015  . tiZANidine (ZANAFLEX) tablet 4 mg  4 mg Oral QHS Rosita Fire, MD   4 mg at 11/01/16 2249  . ursodiol (ACTIGALL) capsule 600 mg  600 mg Oral BID Kathie Dike, MD   600 mg at 11/02/16 1011     Discharge Medications: Please see discharge summary for a list of discharge medications.  Relevant Imaging Results:  Relevant Lab Results:  Additional Information SSN: [id number]. High fall risk.   Benay Pike [place], [place]

## 2016-11-02 NOTE — Progress Notes (Signed)
  Subjective:  Patient has no complaints. His wife states he had 3 bowel movements yesterday. He has not been confused. His appetite has been good.  Objective: Blood pressure 128/70, pulse 86, temperature 98.9 F (37.2 C), temperature source Oral, resp. rate 18, height 5\' 6"  (1.676 m), weight 123 lb 4.8 oz (55.9 kg), SpO2 96 %. Patient is alert and in no distress He has mild tremors but no asterixis. Abdomen is symmetrical soft and nontender. Organomegaly or masses. Scrotal and penile edema is less today. 1-2+ pitting edema involving both lower extremities  Labs/studies Results:   BMET   Recent Labs  10/31/16 0648 11/02/16 0531  NA 138 138  K 4.6 5.0  CL 103 107  CO2 28 23  GLUCOSE 80 325*  BUN 23* 20  CREATININE 1.25* 1.20  CALCIUM 8.0* 7.4*    LFT   Recent Labs  11/01/16 1036 11/02/16 0531  PROT  --  4.1*  ALBUMIN 1.3* 1.7*  AST  --  15  ALT  --  7*  ALKPHOS  --  205*  BILITOT  --  0.3      Assessment:  #1. Advanced cirrhosis with ascites and anasarca.   patient appears to be responding to IV albumin. Penile and scrotal edema has decreased. Clinically he does not have ascites. Furosemide dose may have to be decreased if serum creatinine goes up. Will add spironolactone if fluid overload worsens. #2. Chronic pancreatitis with pancreatic insufficiency. He is on pancreatic enzyme supplement not having diarrhea. It remains to be seen if nutritional status would improve with higher dose of pancreatic enzyme supplement. #3. Hepatic encephalopathy. Patient was begun on low-dose lactulose yesterday. Will recheck serum ammonia in a.m. #4. Falling episodes and gait disorder. Suspect multifactorial etiology. PT consult pending.  Recommendations:  Two more doses of IV albumin. Serum ammonia in a.m.

## 2016-11-02 NOTE — Clinical Social Work Note (Signed)
Clinical Social Work Assessment  Patient Details  Name: [name] [name] MRN: [id number] Date of Birth: Jun 19, 1966  Date of referral:  11/02/16               Reason for consult:  Discharge Planning                Permission sought to share information with:  Family Supports Permission granted to share information::  Yes, Verbal Permission Granted  Name::     Engineer, civil (consulting)::     Relationship::  wife  Contact Information:     Housing/Transportation Living arrangements for the past 2 months:  Single Family Home Source of Information:  Patient, Spouse Patient Interpreter Needed:  None Criminal Activity/Legal Involvement Pertinent to Current Situation/Hospitalization:  No - Comment as needed Significant Relationships:  Spouse Lives with:  Spouse, Adult Children Do you feel safe going back to the place where you live?   (needs rehab first) Need for family participation in patient care:  Yes (Comment)  Care giving concerns:  Pt's wife feels she cannot continue to meet pt's care needs at home in current condition.    Social Worker assessment / plan:  CSW met with pt and pt's wife at bedside. Pt's daughter lives with them and pt generally has someone at home with him most of the time. He has been experiencing weakness for several months. Pt's wife has been assisting him with all ADLs. PT evaluated pt and recommended SNF. CSW discussed placement process, including Medicare coverage/criteria. Pt and wife request [facility] if possible.   Employment status:  Disabled (Comment on whether or not currently receiving Disability) Insurance information:  Medicare PT Recommendations:  [place] / Referral to community resources:  [place]  Patient/Family's Response to care:  Pt reluctant to agree to SNF, but admits it is necessary. Wife strongly agrees. Encouraged pt that plan is for short term.   Patient/Family's Understanding of and Emotional Response  to Diagnosis, Current Treatment, and Prognosis:  Pt and wife aware of diagnosis and will follow up in AM with MD for update.   Emotional Assessment Appearance:  Appears stated age Attitude/Demeanor/Rapport:  Other (Cooperative) Affect (typically observed):  Accepting Orientation:  Oriented to Self, Oriented to Place, Oriented to  Time, Oriented to Situation Alcohol / Substance use:  Not Applicable Psych involvement (Current and /or in the community):  No (Comment)  Discharge Needs  Concerns to be addressed:  Discharge Planning Concerns Readmission within the last 30 days:  No Current discharge risk:  Physical Impairment Barriers to Discharge:  Continued Medical Work up   Salome Arnt, [place] 11/02/2016, 2:03 PM [phone]

## 2016-11-02 NOTE — Care Management Note (Signed)
Case Management Note  Patient Details  Name: [name] [name] MRN: [id number] Date of Birth: 1965-12-02  Subjective/Objective:                  Pt admitted with cirrhosis. She is from home, lives with family. PT has recommended SNF and pt is agreeable. CSW working with pt/family on placement process.   Action/Plan: No CM needs anticipated.   Expected Discharge Date:    11/04/2016               Expected Discharge Plan:  [place]  In-House Referral:  Clinical Social Work  Discharge planning Services  CM Consult  Post Acute Care Choice:  NA Choice offered to:  NA  Status of Service:  Completed, signed off  If discussed at [place] of Stay Meetings, dates discussed:  11/02/2016  Sherald Barge, RN 11/02/2016, 2:53 PM

## 2016-11-02 NOTE — Clinical Social Work Placement (Signed)
   CLINICAL SOCIAL WORK PLACEMENT  NOTE  Date:  11/02/2016  Patient Details  Name: [name] [name] MRN: [id number] Date of Birth: [birth year]  Clinical Social Work is seeking post-discharge placement for this patient at the [place] level of care (*CSW will initial, date and re-position this form in  chart as items are completed):  Yes   Patient/family provided with [place] Work Department's list of facilities offering this level of care within the geographic area requested by the patient (or if unable, by the patient's family).  Yes   Patient/family informed of their freedom to choose among providers that offer the needed level of care, that participate in Medicare, Medicaid or managed care program needed by the patient, have an available bed and are willing to accept the patient.  Yes   Patient/family informed of [facility]'s ownership interest in [facility] and [facility], as well as of the fact that they are under no obligation to receive care at these facilities.  PASRR submitted to EDS on 11/02/16     PASRR number received on 11/02/16     Existing PASRR number confirmed on       FL2 transmitted to all facilities in geographic area requested by pt/family on 11/02/16     FL2 transmitted to all facilities within larger geographic area on       Patient informed that his/her managed care company has contracts with or will negotiate with certain facilities, including the following:            Patient/family informed of bed offers received.  Patient chooses bed at       Physician recommends and patient chooses bed at      Patient to be transferred to   on  .  Patient to be transferred to facility by       Patient family notified on   of transfer.  Name of family member notified:        PHYSICIAN       Additional Comment:    _______________________________________________ Salome Arnt, LCSW 11/02/2016, 2:00  PM [phone]

## 2016-11-02 NOTE — Evaluation (Signed)
Physical Therapy Evaluation Patient Details Name: [name] [name] MRN: [id number] DOB: 01/24/1966 Today's Date: 11/02/2016   History of Present Illness  51 y.o. male with medical history significant of hypertension, diabetes, chronic pancreatitis who presents to the hospital with complaints of generalized weakness. Patient reports that he's had generalized weakness and difficulty walking for the past 6 months, which has progressively gotten worse. He now has associated numbness in his lower extremities. Patient also complains of scrotal edema and generalized anasarca. The past 2 months. He was placed on Lasix by his nephrologist which she has been taking. No diarrhea, vomiting. Oral intake has been adequate per patient's wife. His weight has been steady. No fever, cough, shortness of breath. He was evaluated in emergency room where he was found to be hypokalemic with potassium of 2.5 and evidence of anasarca.  Dx:  Advanced cirrhosis with ascites and anasarca, Chronic pancreatitis with pancreatic insufficiency, Hepatic encephalopathy  Clinical Impression  Pt received in bed, wife present, and pt is agreeable to PT evaluation.  Pt expresses that he always uses a walker for ambulation, but requires assistance for dressing and bathing. His wife performs all the household duties, as well as driving and running errands.  He does not go out into the community unless it is to the doctor's.  During PT evaluation today, he required Mod A for bed mobility, and Max A for sit<>stand and transfer bed<>chair with RW.  During transfer, pt legs begin to jerk, therefore further gait is deferred.  Pt's wife states that he has had >15 falls in the past 6 months, and what normally happens is that his legs begin to jerk and then they give out.  Due to his need for increased assistance, and high recurrent fall risk, he is recommended for SNF to optimize his functional mobility status prior to returning home.     Follow Up  Recommendations SNF    Equipment Recommendations  None recommended by PT    Recommendations for Other Services       Precautions / Restrictions Precautions Precautions: Fall Precaution Comments: more than 15.  Pt and wife state that his legs will start jerking and then give out.  Restrictions Weight Bearing Restrictions: No      Mobility  Bed Mobility Overal bed mobility: Needs Assistance Bed Mobility: Supine to Sit     Supine to sit: Mod assist;HOB elevated     General bed mobility comments: Pt requires increased time and demonstrates poor motor planning.  Requires 1 step cues for where to put his hands and how to move his LE's to get out of the bed.   Transfers Overall transfer level: Needs assistance Equipment used: Rolling walker (2 wheeled) Transfers: Sit to/from [facility] Sit to Stand: Mod assist Stand pivot transfers: Max assist       General transfer comment: Pt demonstrates B LE jerking during transfer.  Ambulation/Gait Ambulation/Gait assistance:  (NA due to poor standing and trasfers.)              Stairs            Wheelchair Mobility    Modified Rankin (Stroke Patients Only)       Balance Overall balance assessment: History of Falls;Needs assistance Sitting-balance support: Feet supported;Bilateral upper extremity supported Sitting balance-Leahy Scale: Fair Sitting balance - Comments: Pt requires vc's for anterior weight shift.   Postural control: Posterior lean Standing balance support: Bilateral upper extremity supported Standing balance-Leahy Scale: Zero  Pertinent Vitals/Pain Pain Assessment: 0-10 Pain Score: 5  Pain Location: L LE  Pain Descriptors / Indicators: Throbbing Pain Intervention(s): Limited activity within patient's tolerance;Monitored during session;Repositioned    Home Living   Living Arrangements: Spouse/significant other (and wife's dtr and 2  grandchildren (17 and 66yo boys)) Available Help at Discharge: Available 24 hours/day Type of Home: Mobile home Home Access: Stairs to enter   Entrance Stairs-Number of Steps: 4 steps to enter with HR.  Home Layout: One level Home Equipment: Clinical cytogeneticist - 2 wheels;Walker - 4 wheels Additional Comments: Rollator is in the car.      Prior Function     Gait / Transfers Assistance Needed: Pt ambulates with the RW in the house.  Pt is mostly a household ambulator.  He does not get out at the stores.  ADL's / Homemaking Assistance Needed: pt required assistance for dressing, and bathing.   Assistance provided by wife.  Not driving.  Wife assists with running errands.          Hand Dominance   Dominant Hand: Right    Extremity/Trunk Assessment   Upper Extremity Assessment Upper Extremity Assessment: Generalized weakness    Lower Extremity Assessment Lower Extremity Assessment: Generalized weakness       Communication      Cognition Arousal/Alertness: Awake/alert Behavior During Therapy: WFL for tasks assessed/performed Overall Cognitive Status: Impaired/Different from baseline Area of Impairment: Orientation Orientation Level: Time;Situation;Disoriented to                  General Comments General comments (skin integrity, edema, etc.): Pt has extremely dry, flaking skin.  scrapes noted to B anterior distal tibia, and R great toe with increased build up of dry skin under nail.      Exercises     Assessment/Plan    PT Assessment Patient needs continued PT services  PT Problem List Decreased strength;Decreased activity tolerance;Decreased balance;Decreased mobility;Decreased coordination;Decreased cognition;Decreased knowledge of use of DME;Decreased safety awareness;Decreased knowledge of precautions;Pain;Decreased skin integrity          PT Treatment Interventions DME instruction;Gait training;Functional mobility training;Therapeutic  activities;Therapeutic exercise;Balance training;Cognitive remediation;Patient/family education;Wheelchair mobility training    PT Goals (Current goals can be found in the Care Plan section)  Acute Rehab PT Goals Patient Stated Goal: To get stronger PT Goal Formulation: With patient/family Time For Goal Achievement: 11/09/16 Potential to Achieve Goals: Fair    Frequency Min 3X/week   Barriers to discharge Decreased caregiver support Pt lives with his wife who is not able to lift him due to back and knee problems.     Co-evaluation               End of Session Equipment Utilized During Treatment: Gait belt Activity Tolerance: Patient limited by fatigue Patient left: in chair;with call bell/phone within reach;with family/visitor present Nurse Communication: Mobility status;Need for lift equipment Ander Purpura, RN notified of pt's mobiltiy status, and need to use STEDY for transfers.  Mobility sheet left hanging in the room. )    Functional Assessment Tool Used: [employer]	[place] AM-PAC "6-clicks"  Functional Limitation: Mobility: Walking and moving around Mobility: Walking and Moving Around Current Status [phone]): At least 40 percent but less than 60 percent impaired, limited or restricted Mobility: Walking and Moving Around Goal Status [phone]): At least 20 percent but less than 40 percent impaired, limited or restricted    Time: 1213-1245 PT Time Calculation (min) (ACUTE ONLY): 32 min   Charges:   PT Evaluation $PT Eval Low  Complexity: 1 Procedure PT Treatments $Therapeutic Activity: 8-22 mins   PT G Codes:   PT G-Codes **NOT FOR INPATIENT CLASS** Functional Assessment Tool Used: [employer] "6-clicks"  Functional Limitation: Mobility: Walking and moving around Mobility: Walking and Moving Around Current Status [phone]): At least 40 percent but less than 60 percent impaired, limited or restricted Mobility: Walking and Moving Around Goal Status [phone]): At least  20 percent but less than 40 percent impaired, limited or restricted    Beth Carnetta [place], PT, DPT X: [phone]

## 2016-11-02 NOTE — Progress Notes (Signed)
Subjective: Patient is feeling better. His appetite is improving. His albumin also slightly improved. He is very weak and unable to ambulate. Objective: Vital signs in last 24 hours: Temp:  [98.5 F (36.9 C)-98.9 F (37.2 C)] 98.9 F (37.2 C) (01/30 0600) Pulse Rate:  [75-95] 86 (01/30 0600) Resp:  [16-20] 18 (01/30 0600) BP: (116-139)/(70-80) 128/70 (01/30 0600) SpO2:  [96 %-100 %] 96 % (01/30 0600) Weight:  [55.9 kg (123 lb 4.8 oz)] 55.9 kg (123 lb 4.8 oz) (01/30 0704) Weight change:  Last BM Date: 11/01/16  Intake/Output from previous day: 01/29 0701 - 01/30 0700 In: 440 [P.O.:240; IV Piggyback:200] Out: 1701 [Urine:1700; Stool:1]  PHYSICAL EXAM General appearance: cachectic and fatigued Resp: clear to auscultation bilaterally Cardio: S1, S2 normal GI: soft, non-tender; bowel sounds normal; no masses,  no organomegaly Extremities: edema 3+++ LEG EDEMA  Lab Results:  Results for orders placed or performed during the hospital encounter of 10/27/16 (from the past 48 hour(s))  Glucose, capillary     Status: Abnormal   Collection Time: 10/31/16 11:12 AM  Result Value Ref Range   Glucose-Capillary 280 (H) 65 - 99 mg/dL   Comment 1 Notify RN   Glucose, capillary     Status: Abnormal   Collection Time: 10/31/16  4:15 PM  Result Value Ref Range   Glucose-Capillary 61 (L) 65 - 99 mg/dL   Comment 1 Notify RN   Glucose, capillary     Status: None   Collection Time: 10/31/16  9:02 PM  Result Value Ref Range   Glucose-Capillary 99 65 - 99 mg/dL  Glucose, capillary     Status: Abnormal   Collection Time: 11/01/16  8:14 AM  Result Value Ref Range   Glucose-Capillary 125 (H) 65 - 99 mg/dL  Albumin     Status: Abnormal   Collection Time: 11/01/16 10:36 AM  Result Value Ref Range   Albumin 1.3 (L) 3.5 - 5.0 g/dL  Glucose, capillary     Status: Abnormal   Collection Time: 11/01/16 11:50 AM  Result Value Ref Range   Glucose-Capillary 164 (H) 65 - 99 mg/dL  Glucose, capillary      Status: Abnormal   Collection Time: 11/01/16  4:41 PM  Result Value Ref Range   Glucose-Capillary 60 (L) 65 - 99 mg/dL   Comment 1 Notify RN    Comment 2 Document in Chart   Glucose, capillary     Status: None   Collection Time: 11/01/16  5:11 PM  Result Value Ref Range   Glucose-Capillary 92 65 - 99 mg/dL   Comment 1 Notify RN    Comment 2 Document in Chart   Glucose, capillary     Status: Abnormal   Collection Time: 11/01/16 10:07 PM  Result Value Ref Range   Glucose-Capillary 174 (H) 65 - 99 mg/dL   Comment 1 Notify RN    Comment 2 Document in Chart   Comprehensive metabolic panel     Status: Abnormal   Collection Time: 11/02/16  5:31 AM  Result Value Ref Range   Sodium 138 135 - 145 mmol/L   Potassium 5.0 3.5 - 5.1 mmol/L   Chloride 107 101 - 111 mmol/L   CO2 23 22 - 32 mmol/L   Glucose, Bld 325 (H) 65 - 99 mg/dL   BUN 20 6 - 20 mg/dL   Creatinine, Ser 1.20 0.61 - 1.24 mg/dL   Calcium 7.4 (L) 8.9 - 10.3 mg/dL   Total Protein 4.1 (L) 6.5 - 8.1 g/dL  Albumin 1.7 (L) 3.5 - 5.0 g/dL   AST 15 15 - 41 U/L   ALT 7 (L) 17 - 63 U/L   Alkaline Phosphatase 205 (H) 38 - 126 U/L   Total Bilirubin 0.3 0.3 - 1.2 mg/dL   GFR calc non Af Amer >60 >60 mL/min   GFR calc Af Amer >60 >60 mL/min    Comment: (NOTE) The eGFR has been calculated using the CKD EPI equation. This calculation has not been validated in all clinical situations. eGFR's persistently <60 mL/min signify possible Chronic Kidney Disease.    Anion gap 8 5 - 15    ABGS No results for input(s): PHART, PO2ART, TCO2, HCO3 in the last 72 hours.  Invalid input(s): PCO2 CULTURES No results found for this or any previous visit (from the past 240 hour(s)). Studies/Results: No results found.  Medications: I have reviewed the patient's current medications.  Assesment: 2 Active Problems:   Type 1 diabetes mellitus, uncontrolled (HCC)   Hyperlipidemia   Essential hypertension, benign   GERD   Primary  hypothyroidism   Pancreatic insufficiency   Generalized weakness   Hypokalemia   Hypoalbuminemia   Anasarca   Hypoglycemia  liver cirrhosis with asscites   Plan:  Medications reviewed Will continue to  [facility] Physical therapy pending Social worker evaluation pending Will start low dose lantus    LOS: 6 days   [name] [name] 10/28/2016, 8:27 AM

## 2016-11-03 DIAGNOSIS — R2681 Unsteadiness on feet: Secondary | ICD-10-CM | POA: Diagnosis not present

## 2016-11-03 DIAGNOSIS — F17210 Nicotine dependence, cigarettes, uncomplicated: Secondary | ICD-10-CM | POA: Diagnosis not present

## 2016-11-03 DIAGNOSIS — E1165 Type 2 diabetes mellitus with hyperglycemia: Secondary | ICD-10-CM | POA: Diagnosis not present

## 2016-11-03 DIAGNOSIS — I10 Essential (primary) hypertension: Secondary | ICD-10-CM | POA: Diagnosis not present

## 2016-11-03 DIAGNOSIS — R278 Other lack of coordination: Secondary | ICD-10-CM | POA: Diagnosis not present

## 2016-11-03 DIAGNOSIS — K861 Other chronic pancreatitis: Secondary | ICD-10-CM | POA: Diagnosis not present

## 2016-11-03 DIAGNOSIS — E108 Type 1 diabetes mellitus with unspecified complications: Secondary | ICD-10-CM | POA: Diagnosis not present

## 2016-11-03 DIAGNOSIS — Z23 Encounter for immunization: Secondary | ICD-10-CM | POA: Diagnosis not present

## 2016-11-03 DIAGNOSIS — E785 Hyperlipidemia, unspecified: Secondary | ICD-10-CM | POA: Diagnosis not present

## 2016-11-03 DIAGNOSIS — E109 Type 1 diabetes mellitus without complications: Secondary | ICD-10-CM | POA: Diagnosis not present

## 2016-11-03 DIAGNOSIS — K219 Gastro-esophageal reflux disease without esophagitis: Secondary | ICD-10-CM | POA: Diagnosis not present

## 2016-11-03 DIAGNOSIS — K7460 Unspecified cirrhosis of liver: Secondary | ICD-10-CM | POA: Diagnosis not present

## 2016-11-03 DIAGNOSIS — E039 Hypothyroidism, unspecified: Secondary | ICD-10-CM | POA: Diagnosis not present

## 2016-11-03 DIAGNOSIS — Z452 Encounter for adjustment and management of vascular access device: Secondary | ICD-10-CM | POA: Diagnosis not present

## 2016-11-03 DIAGNOSIS — R531 Weakness: Secondary | ICD-10-CM | POA: Diagnosis not present

## 2016-11-03 DIAGNOSIS — M6281 Muscle weakness (generalized): Secondary | ICD-10-CM | POA: Diagnosis not present

## 2016-11-03 DIAGNOSIS — E43 Unspecified severe protein-calorie malnutrition: Secondary | ICD-10-CM | POA: Diagnosis not present

## 2016-11-03 DIAGNOSIS — K8689 Other specified diseases of pancreas: Secondary | ICD-10-CM | POA: Diagnosis not present

## 2016-11-03 DIAGNOSIS — R601 Generalized edema: Secondary | ICD-10-CM | POA: Diagnosis not present

## 2016-11-03 DIAGNOSIS — E8809 Other disorders of plasma-protein metabolism, not elsewhere classified: Secondary | ICD-10-CM | POA: Diagnosis not present

## 2016-11-03 DIAGNOSIS — K769 Liver disease, unspecified: Secondary | ICD-10-CM | POA: Diagnosis not present

## 2016-11-03 DIAGNOSIS — E119 Type 2 diabetes mellitus without complications: Secondary | ICD-10-CM | POA: Diagnosis not present

## 2016-11-03 DIAGNOSIS — Z79899 Other long term (current) drug therapy: Secondary | ICD-10-CM | POA: Diagnosis not present

## 2016-11-03 DIAGNOSIS — E876 Hypokalemia: Secondary | ICD-10-CM | POA: Diagnosis not present

## 2016-11-03 DIAGNOSIS — E162 Hypoglycemia, unspecified: Secondary | ICD-10-CM | POA: Diagnosis not present

## 2016-11-03 DIAGNOSIS — K7291 Hepatic failure, unspecified with coma: Secondary | ICD-10-CM | POA: Diagnosis not present

## 2016-11-03 DIAGNOSIS — Z466 Encounter for fitting and adjustment of urinary device: Secondary | ICD-10-CM | POA: Diagnosis not present

## 2016-11-03 LAB — GLUCOSE, CAPILLARY
GLUCOSE-CAPILLARY: 175 mg/dL — AB (ref 65–99)
GLUCOSE-CAPILLARY: 127 mg/dL — AB (ref 65–99)
GLUCOSE-CAPILLARY: 80 mg/dL (ref 65–99)
Glucose-Capillary: 28 mg/dL — CL (ref 65–99)

## 2016-11-03 LAB — CREATININE, SERUM
Creatinine, Ser: 1.21 mg/dL (ref 0.61–1.24)
GFR calc Af Amer: >60 mL/min (ref >60)
GFR calc non Af Amer: >60 mL/min (ref >60)

## 2016-11-03 MED ORDER — PANCRELIPASE (LIP-PROT-AMYL) 12000-38000 UNITS PO CPEP: 12000.0000 [IU] | ORAL_CAPSULE | Freq: Two times a day (BID) | ORAL | 3 refills | Status: AC

## 2016-11-03 MED ORDER — OXYCODONE HCL 30 MG PO TABS: 30.0000 mg | ORAL_TABLET | ORAL | 0 refills | Status: AC

## 2016-11-03 MED ORDER — FERROUS SULFATE 325 (65 FE) MG PO TABS: 325.0000 mg | ORAL_TABLET | Freq: Every day | ORAL | 3 refills | Status: AC

## 2016-11-03 MED ORDER — INSULIN GLARGINE 100 UNIT/ML ~~LOC~~ SOLN: 10.0000 [IU] | Freq: Every day | SUBCUTANEOUS | 11 refills | Status: AC

## 2016-11-03 MED ORDER — INSULIN ASPART 100 UNIT/ML ~~LOC~~ SOLN: 0.0000 [IU] | Freq: Every day | SUBCUTANEOUS | 11 refills | Status: AC

## 2016-11-03 MED ORDER — LACTULOSE 10 GM/15ML PO SOLN: 10.0000 g | Freq: Two times a day (BID) | ORAL | 0 refills | Status: AC

## 2016-11-03 MED ORDER — POTASSIUM CHLORIDE CRYS ER 20 MEQ PO TBCR: 20.0000 meq | EXTENDED_RELEASE_TABLET | Freq: Two times a day (BID) | ORAL | 3 refills | Status: AC

## 2016-11-03 MED ORDER — DEXTROSE 50 % IV SOLN
INTRAVENOUS | Status: AC
  Administered 2016-11-03: 50 mL
  Filled 2016-11-03: qty 50

## 2016-11-03 NOTE — Progress Notes (Signed)
Results for TURON, NORTHCRAFT (MRN [id number]) as of 11/03/2016 10:27  Ref. Range 11/02/2016 18:33 11/02/2016 22:42 11/03/2016 05:02 11/03/2016 05:20 11/03/2016 08:12  Glucose-Capillary Latest Ref Range: 65 - 99 mg/dL 97 140 (H) 28 (LL) 175 (H) 127 (H)  Noted that patient had a CBG of 28 mg/dl early this am. Recommend decreasing Novolog correction scale to SENSITIVE TID. Will continue to monitor blood sugars while in the hospital. Harvel Ricks RN BSN CDE

## 2016-11-03 NOTE — Progress Notes (Signed)
Pt being discharged to SNF, vitals stable, no c/o pain, no questions for nurse

## 2016-11-03 NOTE — Progress Notes (Signed)
Patient found to have capillary blood glucose of 28 accompanied with diaphoresis at 0502. Pt given juice and an amp of D50 at 0505. Capillary blood glucose increased to 175 at 0616. Dr Legrand Rams made aware; will continue to monitor.

## 2016-11-03 NOTE — Care Management Important Message (Signed)
Important Message  Patient Details  Name: RAY TOYAMA MRN: [id number] Date of Birth: May 10, 1966   Medicare Important Message Given:  Yes    Sherald Barge, RN 11/03/2016, 4:16 PM

## 2016-11-03 NOTE — Progress Notes (Signed)
Patient ID: [name] [name], male   DOB: [birth year], 51 y.o.   MRN: [id number] No complaints. ONe BM yesterday. He remains alert and oriented. Blood pressure 109/69, pulse 93, temperature 97.7 F (36.5 C), temperature source Axillary, resp. rate 18, height 5\' 6"  (1.676 m), weight 123 lb 4.8 oz (55.9 kg), SpO2 98 %. Alert and oriented. Continues to have penile edema. 1-2+ edema to lower extremities. CBC    Component Value Date/Time   WBC 8.3 10/28/2016 0549   RBC 3.56 (L) 10/28/2016 0549   HGB 9.6 (L) 10/28/2016 0549   HCT 28.4 (L) 10/28/2016 0549   PLT 203 10/28/2016 0549   MCV 79.8 10/28/2016 0549   MCH 27.0 10/28/2016 0549   MCHC 33.8 10/28/2016 0549   RDW 16.5 (H) 10/28/2016 0549   LYMPHSABS 1.9 10/27/2016 0529   MONOABS 0.3 10/27/2016 0529   EOSABS 0.1 10/27/2016 0529   BASOSABS 0.0 10/27/2016 0529   Cirrhosis with ascites and anascara. Continues to hae penie edema. Abdomen is soft.  Will continue to follow.

## 2016-11-03 NOTE — Clinical Social Work Placement (Signed)
   CLINICAL SOCIAL WORK PLACEMENT  NOTE  Date:  11/03/2016  Patient Details  Name: [name] [name] MRN: [id number] Date of Birth: 11-20-1965  Clinical Social Work is seeking post-discharge placement for this patient at the [place] level of care (*CSW will initial, date and re-position this form in  chart as items are completed):  Yes   Patient/family provided with [place] Work Department's list of facilities offering this level of care within the geographic area requested by the patient (or if unable, by the patient's family).  Yes   Patient/family informed of their freedom to choose among providers that offer the needed level of care, that participate in Medicare, Medicaid or managed care program needed by the patient, have an available bed and are willing to accept the patient.  Yes   Patient/family informed of [facility]'s ownership interest in [facility] and [facility], as well as of the fact that they are under no obligation to receive care at these facilities.  PASRR submitted to EDS on 11/02/16     PASRR number received on 11/02/16     Existing PASRR number confirmed on       FL2 transmitted to all facilities in geographic area requested by pt/family on 11/02/16     FL2 transmitted to all facilities within larger geographic area on       Patient informed that his/her managed care company has contracts with or will negotiate with certain facilities, including the following:        Yes   Patient/family informed of bed offers received.  Patient [facility] bed at [facility]     Physician recommends and patient chooses bed at      Patient to be transferred to [facility] [place] on 11/03/16.  Patient to be transferred to facility by wife     Patient family notified on 11/03/16 of transfer.  Name of family member notified:  Caren Griffins- wife     PHYSICIAN       Additional Comment:     _______________________________________________ Salome Arnt, [place] 11/03/2016, 8:47 AM [phone]

## 2016-11-03 NOTE — Discharge Summary (Signed)
Physician Discharge Summary  Patient ID: EIKER SAED MRN: [id number] DOB/AGE: 1965/10/12 51 y.o. Primary [place], [place] Admit date: 10/27/2016 Discharge date: 11/03/2016    Discharge Diagnoses:   Active Problems:   Type 1 diabetes mellitus, uncontrolled (HCC)   Hyperlipidemia   Essential hypertension, benign   GERD   Primary hypothyroidism   Pancreatic insufficiency   Generalized weakness   Hypokalemia   Hypoalbuminemia   Anasarca   Hypoglycemia  liver cirrhosis Severe protein calorie malnutrition  Allergies as of 11/03/2016   No Known Allergies     Medication List    STOP taking these medications   gemfibrozil 600 MG tablet Commonly known as:  LOPID   metoCLOPramide 5 MG tablet Commonly known as:  REGLAN   tiZANidine 2 MG tablet Commonly known as:  ZANAFLEX   TOUJEO SOLOSTAR [place] Replaced by:  insulin glargine 100 UNIT/ML injection     TAKE these medications   BC HEADACHE POWDER PO Take 1 packet by mouth daily as needed (for pain).   buPROPion 300 MG 24 hr tablet Commonly known as:  WELLBUTRIN XL Take 300 mg by mouth daily.   cilostazol 50 MG tablet Commonly known as:  PLETAL Take 50 mg by mouth 2 (two) times daily.   citalopram 20 MG tablet Commonly known as:  CELEXA Take 20 mg by mouth daily.   ferrous sulfate 325 (65 FE) MG tablet Take 1 tablet (325 mg total) by mouth daily with breakfast.   fish oil-omega-3 fatty acids 1000 MG capsule Take 1 g by mouth 2 (two) times daily.   furosemide 40 MG tablet Commonly known as:  LASIX Take 20 mg by mouth 2 (two) times daily.   gabapentin 300 MG capsule Commonly known as:  NEURONTIN Take 600 mg by mouth 3 (three) times daily.   glucose blood test strip Commonly known as:  ONE TOUCH TEST STRIPS Use as instructed   glucose blood test strip Use as instructed 4 x daily. E11.65. EZ Max glucometer   insulin aspart 100 UNIT/ML injection Commonly known as:  novoLOG Inject 0-5  Units into the skin at bedtime. What changed:  how much to take  when to take this   insulin glargine 100 UNIT/ML injection Commonly known as:  LANTUS Inject 0.1 mLs (10 Units total) into the skin at bedtime. Replaces:  TOUJEO SOLOSTAR [place]   lactulose 10 GM/15ML solution Commonly known as:  CHRONULAC Take 15 mLs (10 g total) by mouth 2 (two) times daily.   levothyroxine 75 MCG tablet Commonly known as:  SYNTHROID, LEVOTHROID TAKE 1 TABLET BY MOUTH EVERY MORNING.   lipase/protease/amylase 12000 units Cpep capsule Commonly known as:  CREON Take 1 capsule (12,000 Units total) by mouth 2 (two) times daily between meals. What changed:  medication strength  how much to take  when to take this   mirtazapine 15 MG tablet Commonly known as:  REMERON Take 1 tablet by mouth at bedtime.   omeprazole 20 MG capsule Commonly known as:  PRILOSEC Take 20 mg by mouth 2 (two) times daily before a meal.   oxycodone 30 MG immediate release tablet Commonly known as:  ROXICODONE Take 1 tablet (30 mg total) by mouth every 4 (four) hours.   potassium chloride SA 20 MEQ tablet Commonly known as:  K-DUR,KLOR-CON Take 1 tablet (20 mEq total) by mouth 2 (two) times daily.   PROAIR HFA 108 (90 Base) MCG/ACT inhaler Generic drug:  albuterol Inhale 2 puffs into the lungs 4 (  four) times daily.   ramipril 5 MG capsule Commonly known as:  ALTACE Take 5 mg by mouth daily.   simvastatin 20 MG tablet Commonly known as:  ZOCOR Take 20 mg by mouth daily.   ursodiol 500 MG tablet Commonly known as:  ACTIGALL Take 500 mg by mouth 2 (two) times daily.   Vitamin D (Ergocalciferol) 50000 units Caps capsule Commonly known as:  DRISDOL TAKE ONE CAPSULE BY MOUTH ONCE WEEKLY. What changed:  See the new instructions.       Discharged Condition: improved    Consults: GI  Significant Diagnostic Studies: US Abdomen Complete  Result Date: 10/28/2016 CLINICAL DATA:  51 year old male. Chronic  calcific pancreatitis. Pancreatic insufficiency. Prior cholecystectomy. Initial encounter. EXAM: ABDOMEN ULTRASOUND COMPLETE COMPARISON:  Abdomen ultrasound 09/03/2016. CT Abdomen and Pelvis 01/01/2011. FINDINGS: Gallbladder: Surgically absent Common bile duct: Probable pneumobilia, as seen on the 2012 CT. CBD size estimated at 2 mm. No intra or extrahepatic biliary ductal dilatation is evident. Liver: Nodular liver contour (image 35). Suggestion of generalized decreased liver volume. No discrete liver lesion. IVC: No abnormality visualized. Pancreas: Poorly visualized. Spleen: Normal splenic size and echogenicity.  4.5 cm in length. Right Kidney: Length: 10.7 cm. Increased cortical echogenicity (image 64). No hydronephrosis or right renal mass. There is a simple appearing 1.3 cm upper pole cyst. Left Kidney: Length: 11.1 cm. Not as well visualized as the right. No hydronephrosis or left renal mass. Abdominal aorta: Evidence of the Calcified aortic atherosclerosis seen by CT. No abdominal aortic aneurysm identified. Other findings: Moderate volume ascites throughout the abdomen. The free fluid is seen surrounding multiple bowel loops in the peritoneal cavity (image 104). IMPRESSION: 1. Nodular, Cirrhotic Liver with moderate volume Ascites. No discrete liver lesion. 2. Surgically absent gallbladder with chronic pneumobilia. No biliary ductal enlargement. 3. Chronic medical renal disease. 4. Chronic calcified aortic atherosclerosis. Electronically Signed   By: Genevie Ann M.D.   On: 10/28/2016 09:08   Dg Chest Port 1 View  Result Date: 10/27/2016 CLINICAL DATA:  51 year old male with shortness of breath. EXAM: PORTABLE CHEST 1 VIEW COMPARISON:  Chest radiograph dated 07/19/2016 FINDINGS: There is shallow inspiration. Stable mild eventration of the right hemidiaphragm. The lungs are clear. There is no pleural effusion or pneumothorax. The cardiac silhouette is within normal limits. No acute osseous pathology.  IMPRESSION: No active disease. Electronically Signed   By: Anner Crete M.D.   On: 10/27/2016 06:43    Lab Results: Basic Metabolic Panel:  Recent Labs  11/02/16 0531 11/03/16 0547  NA 138  --   K 5.0  --   CL 107  --   CO2 23  --   GLUCOSE 325*  --   BUN 20  --   CREATININE 1.20 1.21  CALCIUM 7.4*  --    Liver Function Tests:  Recent Labs  11/01/16 1036 11/02/16 0531  AST  --  15  ALT  --  7*  ALKPHOS  --  205*  BILITOT  --  0.3  PROT  --  4.1*  ALBUMIN 1.3* 1.7*     CBC: No results for input(s): WBC, NEUTROABS, HGB, HCT, MCV, PLT in the last 72 hours.  No results found for this or any previous visit (from the past 240 hour(s)).   Hospital Course:   This is a 51 years old male with history of multiple medical illnesses was admitted due to generalized weakness, anasarca and difficulty to ambulate. He had hypokalemia, hypoalbuminemia and advanced liver disease.  His potassium was replaced. He was evaluated by GI and started on albumin. He is also diuresed. Patient had episodes of hypoglycemia. His insulin was adjusted. Patient is transfer to skilled nursing home for rehab.  Discharge Exam: Blood pressure 109/69, pulse 93, temperature 97.7 F (36.5 C), temperature source Axillary, resp. rate 18, height 5\' 6"  (1.676 m), weight 55.9 kg (123 lb 4.8 oz), SpO2 98 %.    Disposition:  Skilled nursing home.      Signed: Tashika Goodin   11/03/2016, 8:30 AM

## 2016-11-03 NOTE — Progress Notes (Signed)
Day #3 Albumin completed today.  Order discontinued.  Pricilla Larsson, [facility] 11/03/2016 11:23 AM

## 2016-11-04 DIAGNOSIS — E119 Type 2 diabetes mellitus without complications: Secondary | ICD-10-CM | POA: Diagnosis not present

## 2016-11-04 DIAGNOSIS — I10 Essential (primary) hypertension: Secondary | ICD-10-CM | POA: Diagnosis not present

## 2016-11-04 DIAGNOSIS — R531 Weakness: Secondary | ICD-10-CM | POA: Diagnosis not present

## 2016-11-04 DIAGNOSIS — E039 Hypothyroidism, unspecified: Secondary | ICD-10-CM | POA: Diagnosis not present

## 2016-11-05 ENCOUNTER — Emergency Department (HOSPITAL_COMMUNITY)
Admission: EM | Admit: 2016-11-05 | Discharge: 2016-11-05 | Disposition: A | Discharge status: Home / Self Care | Payer: Medicare Other | Attending: Emergency Medicine | Admitting: Emergency Medicine

## 2016-11-05 ENCOUNTER — Encounter (HOSPITAL_COMMUNITY): Payer: Self-pay

## 2016-11-05 DIAGNOSIS — E039 Hypothyroidism, unspecified: Secondary | ICD-10-CM | POA: Insufficient documentation

## 2016-11-05 DIAGNOSIS — F17210 Nicotine dependence, cigarettes, uncomplicated: Secondary | ICD-10-CM | POA: Insufficient documentation

## 2016-11-05 DIAGNOSIS — Z452 Encounter for adjustment and management of vascular access device: Secondary | ICD-10-CM | POA: Diagnosis not present

## 2016-11-05 DIAGNOSIS — I10 Essential (primary) hypertension: Secondary | ICD-10-CM | POA: Insufficient documentation

## 2016-11-05 DIAGNOSIS — Z466 Encounter for fitting and adjustment of urinary device: Secondary | ICD-10-CM | POA: Diagnosis not present

## 2016-11-05 DIAGNOSIS — Z79899 Other long term (current) drug therapy: Secondary | ICD-10-CM | POA: Insufficient documentation

## 2016-11-05 DIAGNOSIS — E109 Type 1 diabetes mellitus without complications: Secondary | ICD-10-CM | POA: Insufficient documentation
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
## 2016-11-05 NOTE — ED Triage Notes (Signed)
Pt was discharged from hospital on 11/03/16 and sent to [facility] for rehab He has foley catheter in place and wants removed. He left [facility] without discharge order because he no longer wanted to stay and they would not remove foley

## 2016-11-05 NOTE — Discharge Instructions (Signed)
Return if any problems.

## 2016-11-11 DIAGNOSIS — E440 Moderate protein-calorie malnutrition: Secondary | ICD-10-CM | POA: Diagnosis not present

## 2016-11-11 DIAGNOSIS — E1142 Type 2 diabetes mellitus with diabetic polyneuropathy: Secondary | ICD-10-CM | POA: Diagnosis not present

## 2016-11-11 DIAGNOSIS — R627 Adult failure to thrive: Secondary | ICD-10-CM | POA: Diagnosis not present

## 2016-11-11 DIAGNOSIS — K7460 Unspecified cirrhosis of liver: Secondary | ICD-10-CM | POA: Diagnosis not present

## 2016-11-15 DIAGNOSIS — G894 Chronic pain syndrome: Secondary | ICD-10-CM | POA: Diagnosis not present

## 2016-11-15 DIAGNOSIS — Z79891 Long term (current) use of opiate analgesic: Secondary | ICD-10-CM | POA: Diagnosis not present

## 2016-11-15 DIAGNOSIS — R1011 Right upper quadrant pain: Secondary | ICD-10-CM | POA: Diagnosis not present

## 2016-11-15 DIAGNOSIS — R2681 Unsteadiness on feet: Secondary | ICD-10-CM | POA: Diagnosis not present

## 2016-11-15 DIAGNOSIS — K861 Other chronic pancreatitis: Secondary | ICD-10-CM | POA: Diagnosis not present

## 2016-11-16 DIAGNOSIS — I10 Essential (primary) hypertension: Secondary | ICD-10-CM | POA: Diagnosis not present

## 2016-11-16 DIAGNOSIS — E1140 Type 2 diabetes mellitus with diabetic neuropathy, unspecified: Secondary | ICD-10-CM | POA: Diagnosis not present

## 2016-11-16 DIAGNOSIS — E8809 Other disorders of plasma-protein metabolism, not elsewhere classified: Secondary | ICD-10-CM | POA: Diagnosis not present

## 2016-11-16 DIAGNOSIS — K7460 Unspecified cirrhosis of liver: Secondary | ICD-10-CM | POA: Diagnosis not present

## 2016-11-16 DIAGNOSIS — M6281 Muscle weakness (generalized): Secondary | ICD-10-CM | POA: Diagnosis not present

## 2016-11-16 DIAGNOSIS — J449 Chronic obstructive pulmonary disease, unspecified: Secondary | ICD-10-CM | POA: Diagnosis not present

## 2016-11-17 ENCOUNTER — Other Ambulatory Visit: Payer: Self-pay | Admitting: "Endocrinology

## 2016-11-17 DIAGNOSIS — E1140 Type 2 diabetes mellitus with diabetic neuropathy, unspecified: Secondary | ICD-10-CM | POA: Diagnosis not present

## 2016-11-17 DIAGNOSIS — M6281 Muscle weakness (generalized): Secondary | ICD-10-CM | POA: Diagnosis not present

## 2016-11-17 DIAGNOSIS — I10 Essential (primary) hypertension: Secondary | ICD-10-CM | POA: Diagnosis not present

## 2016-11-17 DIAGNOSIS — J449 Chronic obstructive pulmonary disease, unspecified: Secondary | ICD-10-CM | POA: Diagnosis not present

## 2016-11-17 DIAGNOSIS — K7460 Unspecified cirrhosis of liver: Secondary | ICD-10-CM | POA: Diagnosis not present

## 2016-11-17 DIAGNOSIS — E8809 Other disorders of plasma-protein metabolism, not elsewhere classified: Secondary | ICD-10-CM | POA: Diagnosis not present

## 2016-11-19 DIAGNOSIS — J449 Chronic obstructive pulmonary disease, unspecified: Secondary | ICD-10-CM | POA: Diagnosis not present

## 2016-11-19 DIAGNOSIS — E8809 Other disorders of plasma-protein metabolism, not elsewhere classified: Secondary | ICD-10-CM | POA: Diagnosis not present

## 2016-11-19 DIAGNOSIS — E1140 Type 2 diabetes mellitus with diabetic neuropathy, unspecified: Secondary | ICD-10-CM | POA: Diagnosis not present

## 2016-11-19 DIAGNOSIS — K7460 Unspecified cirrhosis of liver: Secondary | ICD-10-CM | POA: Diagnosis not present

## 2016-11-19 DIAGNOSIS — M6281 Muscle weakness (generalized): Secondary | ICD-10-CM | POA: Diagnosis not present

## 2016-11-19 DIAGNOSIS — I10 Essential (primary) hypertension: Secondary | ICD-10-CM | POA: Diagnosis not present

## 2016-11-22 DIAGNOSIS — J449 Chronic obstructive pulmonary disease, unspecified: Secondary | ICD-10-CM | POA: Diagnosis not present

## 2016-11-22 DIAGNOSIS — M6281 Muscle weakness (generalized): Secondary | ICD-10-CM | POA: Diagnosis not present

## 2016-11-22 DIAGNOSIS — E8809 Other disorders of plasma-protein metabolism, not elsewhere classified: Secondary | ICD-10-CM | POA: Diagnosis not present

## 2016-11-22 DIAGNOSIS — K7460 Unspecified cirrhosis of liver: Secondary | ICD-10-CM | POA: Diagnosis not present

## 2016-11-22 DIAGNOSIS — I10 Essential (primary) hypertension: Secondary | ICD-10-CM | POA: Diagnosis not present

## 2016-11-22 DIAGNOSIS — E1140 Type 2 diabetes mellitus with diabetic neuropathy, unspecified: Secondary | ICD-10-CM | POA: Diagnosis not present

## 2016-11-24 ENCOUNTER — Ambulatory Visit: Payer: Medicare Other | Admitting: "Endocrinology

## 2016-11-24 DIAGNOSIS — I10 Essential (primary) hypertension: Secondary | ICD-10-CM | POA: Diagnosis not present

## 2016-11-24 DIAGNOSIS — M6281 Muscle weakness (generalized): Secondary | ICD-10-CM | POA: Diagnosis not present

## 2016-11-24 DIAGNOSIS — E1140 Type 2 diabetes mellitus with diabetic neuropathy, unspecified: Secondary | ICD-10-CM | POA: Diagnosis not present

## 2016-11-24 DIAGNOSIS — E8809 Other disorders of plasma-protein metabolism, not elsewhere classified: Secondary | ICD-10-CM | POA: Diagnosis not present

## 2016-11-24 DIAGNOSIS — K7460 Unspecified cirrhosis of liver: Secondary | ICD-10-CM | POA: Diagnosis not present

## 2016-11-24 DIAGNOSIS — J449 Chronic obstructive pulmonary disease, unspecified: Secondary | ICD-10-CM | POA: Diagnosis not present

## 2016-11-25 DIAGNOSIS — M6281 Muscle weakness (generalized): Secondary | ICD-10-CM | POA: Diagnosis not present

## 2016-11-25 DIAGNOSIS — K7460 Unspecified cirrhosis of liver: Secondary | ICD-10-CM | POA: Diagnosis not present

## 2016-11-25 DIAGNOSIS — E8809 Other disorders of plasma-protein metabolism, not elsewhere classified: Secondary | ICD-10-CM | POA: Diagnosis not present

## 2016-11-25 DIAGNOSIS — I10 Essential (primary) hypertension: Secondary | ICD-10-CM | POA: Diagnosis not present

## 2016-11-25 DIAGNOSIS — E1140 Type 2 diabetes mellitus with diabetic neuropathy, unspecified: Secondary | ICD-10-CM | POA: Diagnosis not present

## 2016-11-25 DIAGNOSIS — J449 Chronic obstructive pulmonary disease, unspecified: Secondary | ICD-10-CM | POA: Diagnosis not present

## 2016-11-26 DIAGNOSIS — E1140 Type 2 diabetes mellitus with diabetic neuropathy, unspecified: Secondary | ICD-10-CM | POA: Diagnosis not present

## 2016-11-26 DIAGNOSIS — K7460 Unspecified cirrhosis of liver: Secondary | ICD-10-CM | POA: Diagnosis not present

## 2016-11-26 DIAGNOSIS — J449 Chronic obstructive pulmonary disease, unspecified: Secondary | ICD-10-CM | POA: Diagnosis not present

## 2016-11-26 DIAGNOSIS — I10 Essential (primary) hypertension: Secondary | ICD-10-CM | POA: Diagnosis not present

## 2016-11-26 DIAGNOSIS — E8809 Other disorders of plasma-protein metabolism, not elsewhere classified: Secondary | ICD-10-CM | POA: Diagnosis not present

## 2016-11-26 DIAGNOSIS — M6281 Muscle weakness (generalized): Secondary | ICD-10-CM | POA: Diagnosis not present

## 2016-11-30 DIAGNOSIS — M6281 Muscle weakness (generalized): Secondary | ICD-10-CM | POA: Diagnosis not present

## 2016-11-30 DIAGNOSIS — E8809 Other disorders of plasma-protein metabolism, not elsewhere classified: Secondary | ICD-10-CM | POA: Diagnosis not present

## 2016-11-30 DIAGNOSIS — J449 Chronic obstructive pulmonary disease, unspecified: Secondary | ICD-10-CM | POA: Diagnosis not present

## 2016-11-30 DIAGNOSIS — E1140 Type 2 diabetes mellitus with diabetic neuropathy, unspecified: Secondary | ICD-10-CM | POA: Diagnosis not present

## 2016-11-30 DIAGNOSIS — K7460 Unspecified cirrhosis of liver: Secondary | ICD-10-CM | POA: Diagnosis not present

## 2016-11-30 DIAGNOSIS — I10 Essential (primary) hypertension: Secondary | ICD-10-CM | POA: Diagnosis not present

## 2016-12-01 DIAGNOSIS — J449 Chronic obstructive pulmonary disease, unspecified: Secondary | ICD-10-CM | POA: Diagnosis not present

## 2016-12-01 DIAGNOSIS — I10 Essential (primary) hypertension: Secondary | ICD-10-CM | POA: Diagnosis not present

## 2016-12-01 DIAGNOSIS — M6281 Muscle weakness (generalized): Secondary | ICD-10-CM | POA: Diagnosis not present

## 2016-12-01 DIAGNOSIS — E8809 Other disorders of plasma-protein metabolism, not elsewhere classified: Secondary | ICD-10-CM | POA: Diagnosis not present

## 2016-12-01 DIAGNOSIS — E1140 Type 2 diabetes mellitus with diabetic neuropathy, unspecified: Secondary | ICD-10-CM | POA: Diagnosis not present

## 2016-12-01 DIAGNOSIS — K7460 Unspecified cirrhosis of liver: Secondary | ICD-10-CM | POA: Diagnosis not present

## 2016-12-02 DIAGNOSIS — J449 Chronic obstructive pulmonary disease, unspecified: Secondary | ICD-10-CM | POA: Diagnosis not present

## 2016-12-02 DIAGNOSIS — E1140 Type 2 diabetes mellitus with diabetic neuropathy, unspecified: Secondary | ICD-10-CM | POA: Diagnosis not present

## 2016-12-02 DIAGNOSIS — M6281 Muscle weakness (generalized): Secondary | ICD-10-CM | POA: Diagnosis not present

## 2016-12-02 DIAGNOSIS — I10 Essential (primary) hypertension: Secondary | ICD-10-CM | POA: Diagnosis not present

## 2016-12-02 DIAGNOSIS — K7460 Unspecified cirrhosis of liver: Secondary | ICD-10-CM | POA: Diagnosis not present

## 2016-12-02 DIAGNOSIS — E8809 Other disorders of plasma-protein metabolism, not elsewhere classified: Secondary | ICD-10-CM | POA: Diagnosis not present

## 2016-12-03 DIAGNOSIS — I10 Essential (primary) hypertension: Secondary | ICD-10-CM | POA: Diagnosis not present

## 2016-12-03 DIAGNOSIS — K7460 Unspecified cirrhosis of liver: Secondary | ICD-10-CM | POA: Diagnosis not present

## 2016-12-03 DIAGNOSIS — M6281 Muscle weakness (generalized): Secondary | ICD-10-CM | POA: Diagnosis not present

## 2016-12-03 DIAGNOSIS — E1140 Type 2 diabetes mellitus with diabetic neuropathy, unspecified: Secondary | ICD-10-CM | POA: Diagnosis not present

## 2016-12-03 DIAGNOSIS — E8809 Other disorders of plasma-protein metabolism, not elsewhere classified: Secondary | ICD-10-CM | POA: Diagnosis not present

## 2016-12-03 DIAGNOSIS — J449 Chronic obstructive pulmonary disease, unspecified: Secondary | ICD-10-CM | POA: Diagnosis not present

## 2016-12-06 DIAGNOSIS — J449 Chronic obstructive pulmonary disease, unspecified: Secondary | ICD-10-CM | POA: Diagnosis not present

## 2016-12-06 DIAGNOSIS — E1140 Type 2 diabetes mellitus with diabetic neuropathy, unspecified: Secondary | ICD-10-CM | POA: Diagnosis not present

## 2016-12-06 DIAGNOSIS — M6281 Muscle weakness (generalized): Secondary | ICD-10-CM | POA: Diagnosis not present

## 2016-12-06 DIAGNOSIS — I10 Essential (primary) hypertension: Secondary | ICD-10-CM | POA: Diagnosis not present

## 2016-12-06 DIAGNOSIS — E8809 Other disorders of plasma-protein metabolism, not elsewhere classified: Secondary | ICD-10-CM | POA: Diagnosis not present

## 2016-12-06 DIAGNOSIS — K7460 Unspecified cirrhosis of liver: Secondary | ICD-10-CM | POA: Diagnosis not present

## 2016-12-08 DIAGNOSIS — E1140 Type 2 diabetes mellitus with diabetic neuropathy, unspecified: Secondary | ICD-10-CM | POA: Diagnosis not present

## 2016-12-08 DIAGNOSIS — M6281 Muscle weakness (generalized): Secondary | ICD-10-CM | POA: Diagnosis not present

## 2016-12-08 DIAGNOSIS — I10 Essential (primary) hypertension: Secondary | ICD-10-CM | POA: Diagnosis not present

## 2016-12-08 DIAGNOSIS — E8809 Other disorders of plasma-protein metabolism, not elsewhere classified: Secondary | ICD-10-CM | POA: Diagnosis not present

## 2016-12-08 DIAGNOSIS — J449 Chronic obstructive pulmonary disease, unspecified: Secondary | ICD-10-CM | POA: Diagnosis not present

## 2016-12-08 DIAGNOSIS — K7460 Unspecified cirrhosis of liver: Secondary | ICD-10-CM | POA: Diagnosis not present

## 2016-12-09 DIAGNOSIS — I10 Essential (primary) hypertension: Secondary | ICD-10-CM | POA: Diagnosis not present

## 2016-12-09 DIAGNOSIS — E1142 Type 2 diabetes mellitus with diabetic polyneuropathy: Secondary | ICD-10-CM | POA: Diagnosis not present

## 2016-12-10 DIAGNOSIS — M6281 Muscle weakness (generalized): Secondary | ICD-10-CM | POA: Diagnosis not present

## 2016-12-14 DIAGNOSIS — M6281 Muscle weakness (generalized): Secondary | ICD-10-CM | POA: Diagnosis not present

## 2016-12-14 DIAGNOSIS — K7460 Unspecified cirrhosis of liver: Secondary | ICD-10-CM | POA: Diagnosis not present

## 2016-12-14 DIAGNOSIS — E1140 Type 2 diabetes mellitus with diabetic neuropathy, unspecified: Secondary | ICD-10-CM | POA: Diagnosis not present

## 2016-12-14 DIAGNOSIS — J449 Chronic obstructive pulmonary disease, unspecified: Secondary | ICD-10-CM | POA: Diagnosis not present

## 2016-12-14 DIAGNOSIS — E8809 Other disorders of plasma-protein metabolism, not elsewhere classified: Secondary | ICD-10-CM | POA: Diagnosis not present

## 2016-12-14 DIAGNOSIS — I10 Essential (primary) hypertension: Secondary | ICD-10-CM | POA: Diagnosis not present

## 2016-12-16 DIAGNOSIS — M25551 Pain in right hip: Secondary | ICD-10-CM | POA: Diagnosis not present

## 2016-12-16 DIAGNOSIS — M25552 Pain in left hip: Secondary | ICD-10-CM | POA: Diagnosis not present

## 2016-12-16 DIAGNOSIS — G894 Chronic pain syndrome: Secondary | ICD-10-CM | POA: Diagnosis not present

## 2016-12-16 DIAGNOSIS — Z79891 Long term (current) use of opiate analgesic: Secondary | ICD-10-CM | POA: Diagnosis not present

## 2016-12-16 DIAGNOSIS — M5416 Radiculopathy, lumbar region: Secondary | ICD-10-CM | POA: Diagnosis not present

## 2016-12-17 DIAGNOSIS — E1140 Type 2 diabetes mellitus with diabetic neuropathy, unspecified: Secondary | ICD-10-CM | POA: Diagnosis not present

## 2016-12-17 DIAGNOSIS — J449 Chronic obstructive pulmonary disease, unspecified: Secondary | ICD-10-CM | POA: Diagnosis not present

## 2016-12-17 DIAGNOSIS — E8809 Other disorders of plasma-protein metabolism, not elsewhere classified: Secondary | ICD-10-CM | POA: Diagnosis not present

## 2016-12-17 DIAGNOSIS — K7460 Unspecified cirrhosis of liver: Secondary | ICD-10-CM | POA: Diagnosis not present

## 2016-12-17 DIAGNOSIS — I10 Essential (primary) hypertension: Secondary | ICD-10-CM | POA: Diagnosis not present

## 2016-12-17 DIAGNOSIS — M6281 Muscle weakness (generalized): Secondary | ICD-10-CM | POA: Diagnosis not present

## 2016-12-20 DIAGNOSIS — I10 Essential (primary) hypertension: Secondary | ICD-10-CM | POA: Diagnosis not present

## 2016-12-20 DIAGNOSIS — E1140 Type 2 diabetes mellitus with diabetic neuropathy, unspecified: Secondary | ICD-10-CM | POA: Diagnosis not present

## 2016-12-20 DIAGNOSIS — M6281 Muscle weakness (generalized): Secondary | ICD-10-CM | POA: Diagnosis not present

## 2016-12-20 DIAGNOSIS — E8809 Other disorders of plasma-protein metabolism, not elsewhere classified: Secondary | ICD-10-CM | POA: Diagnosis not present

## 2016-12-20 DIAGNOSIS — J449 Chronic obstructive pulmonary disease, unspecified: Secondary | ICD-10-CM | POA: Diagnosis not present

## 2016-12-20 DIAGNOSIS — K7460 Unspecified cirrhosis of liver: Secondary | ICD-10-CM | POA: Diagnosis not present

## 2016-12-22 DIAGNOSIS — K7460 Unspecified cirrhosis of liver: Secondary | ICD-10-CM | POA: Diagnosis not present

## 2016-12-22 DIAGNOSIS — M6281 Muscle weakness (generalized): Secondary | ICD-10-CM | POA: Diagnosis not present

## 2016-12-22 DIAGNOSIS — E1140 Type 2 diabetes mellitus with diabetic neuropathy, unspecified: Secondary | ICD-10-CM | POA: Diagnosis not present

## 2016-12-22 DIAGNOSIS — J449 Chronic obstructive pulmonary disease, unspecified: Secondary | ICD-10-CM | POA: Diagnosis not present

## 2016-12-22 DIAGNOSIS — E8809 Other disorders of plasma-protein metabolism, not elsewhere classified: Secondary | ICD-10-CM | POA: Diagnosis not present

## 2016-12-22 DIAGNOSIS — I10 Essential (primary) hypertension: Secondary | ICD-10-CM | POA: Diagnosis not present

## 2016-12-28 DIAGNOSIS — M6281 Muscle weakness (generalized): Secondary | ICD-10-CM | POA: Diagnosis not present

## 2016-12-28 DIAGNOSIS — K7460 Unspecified cirrhosis of liver: Secondary | ICD-10-CM | POA: Diagnosis not present

## 2016-12-28 DIAGNOSIS — E1140 Type 2 diabetes mellitus with diabetic neuropathy, unspecified: Secondary | ICD-10-CM | POA: Diagnosis not present

## 2016-12-28 DIAGNOSIS — I10 Essential (primary) hypertension: Secondary | ICD-10-CM | POA: Diagnosis not present

## 2016-12-28 DIAGNOSIS — J449 Chronic obstructive pulmonary disease, unspecified: Secondary | ICD-10-CM | POA: Diagnosis not present

## 2016-12-28 DIAGNOSIS — E8809 Other disorders of plasma-protein metabolism, not elsewhere classified: Secondary | ICD-10-CM | POA: Diagnosis not present

## 2016-12-30 DIAGNOSIS — J449 Chronic obstructive pulmonary disease, unspecified: Secondary | ICD-10-CM | POA: Diagnosis not present

## 2016-12-30 DIAGNOSIS — I10 Essential (primary) hypertension: Secondary | ICD-10-CM | POA: Diagnosis not present

## 2016-12-30 DIAGNOSIS — E1140 Type 2 diabetes mellitus with diabetic neuropathy, unspecified: Secondary | ICD-10-CM | POA: Diagnosis not present

## 2016-12-30 DIAGNOSIS — K7460 Unspecified cirrhosis of liver: Secondary | ICD-10-CM | POA: Diagnosis not present

## 2016-12-30 DIAGNOSIS — M6281 Muscle weakness (generalized): Secondary | ICD-10-CM | POA: Diagnosis not present

## 2016-12-30 DIAGNOSIS — E8809 Other disorders of plasma-protein metabolism, not elsewhere classified: Secondary | ICD-10-CM | POA: Diagnosis not present

## 2017-01-04 DIAGNOSIS — E8809 Other disorders of plasma-protein metabolism, not elsewhere classified: Secondary | ICD-10-CM | POA: Diagnosis not present

## 2017-01-04 DIAGNOSIS — E1140 Type 2 diabetes mellitus with diabetic neuropathy, unspecified: Secondary | ICD-10-CM | POA: Diagnosis not present

## 2017-01-04 DIAGNOSIS — I10 Essential (primary) hypertension: Secondary | ICD-10-CM | POA: Diagnosis not present

## 2017-01-04 DIAGNOSIS — J449 Chronic obstructive pulmonary disease, unspecified: Secondary | ICD-10-CM | POA: Diagnosis not present

## 2017-01-04 DIAGNOSIS — M6281 Muscle weakness (generalized): Secondary | ICD-10-CM | POA: Diagnosis not present

## 2017-01-04 DIAGNOSIS — K7460 Unspecified cirrhosis of liver: Secondary | ICD-10-CM | POA: Diagnosis not present

## 2017-01-06 DIAGNOSIS — J449 Chronic obstructive pulmonary disease, unspecified: Secondary | ICD-10-CM | POA: Diagnosis not present

## 2017-01-06 DIAGNOSIS — E8809 Other disorders of plasma-protein metabolism, not elsewhere classified: Secondary | ICD-10-CM | POA: Diagnosis not present

## 2017-01-06 DIAGNOSIS — M6281 Muscle weakness (generalized): Secondary | ICD-10-CM | POA: Diagnosis not present

## 2017-01-06 DIAGNOSIS — I10 Essential (primary) hypertension: Secondary | ICD-10-CM | POA: Diagnosis not present

## 2017-01-06 DIAGNOSIS — E1140 Type 2 diabetes mellitus with diabetic neuropathy, unspecified: Secondary | ICD-10-CM | POA: Diagnosis not present

## 2017-01-06 DIAGNOSIS — K7460 Unspecified cirrhosis of liver: Secondary | ICD-10-CM | POA: Diagnosis not present

## 2017-01-08 DIAGNOSIS — J449 Chronic obstructive pulmonary disease, unspecified: Secondary | ICD-10-CM | POA: Diagnosis not present

## 2017-01-08 DIAGNOSIS — E8809 Other disorders of plasma-protein metabolism, not elsewhere classified: Secondary | ICD-10-CM | POA: Diagnosis not present

## 2017-01-08 DIAGNOSIS — M6281 Muscle weakness (generalized): Secondary | ICD-10-CM | POA: Diagnosis not present

## 2017-01-08 DIAGNOSIS — K7460 Unspecified cirrhosis of liver: Secondary | ICD-10-CM | POA: Diagnosis not present

## 2017-01-08 DIAGNOSIS — I10 Essential (primary) hypertension: Secondary | ICD-10-CM | POA: Diagnosis not present

## 2017-01-08 DIAGNOSIS — E1140 Type 2 diabetes mellitus with diabetic neuropathy, unspecified: Secondary | ICD-10-CM | POA: Diagnosis not present

## 2017-01-09 DIAGNOSIS — I10 Essential (primary) hypertension: Secondary | ICD-10-CM | POA: Diagnosis not present

## 2017-01-09 DIAGNOSIS — E1140 Type 2 diabetes mellitus with diabetic neuropathy, unspecified: Secondary | ICD-10-CM | POA: Diagnosis not present

## 2017-01-11 DIAGNOSIS — K7460 Unspecified cirrhosis of liver: Secondary | ICD-10-CM | POA: Diagnosis not present

## 2017-01-11 DIAGNOSIS — E1140 Type 2 diabetes mellitus with diabetic neuropathy, unspecified: Secondary | ICD-10-CM | POA: Diagnosis not present

## 2017-01-11 DIAGNOSIS — E8809 Other disorders of plasma-protein metabolism, not elsewhere classified: Secondary | ICD-10-CM | POA: Diagnosis not present

## 2017-01-11 DIAGNOSIS — M6281 Muscle weakness (generalized): Secondary | ICD-10-CM | POA: Diagnosis not present

## 2017-01-11 DIAGNOSIS — I10 Essential (primary) hypertension: Secondary | ICD-10-CM | POA: Diagnosis not present

## 2017-01-11 DIAGNOSIS — J449 Chronic obstructive pulmonary disease, unspecified: Secondary | ICD-10-CM | POA: Diagnosis not present

## 2017-01-13 DIAGNOSIS — M25551 Pain in right hip: Secondary | ICD-10-CM | POA: Diagnosis not present

## 2017-01-13 DIAGNOSIS — M5416 Radiculopathy, lumbar region: Secondary | ICD-10-CM | POA: Diagnosis not present

## 2017-01-13 DIAGNOSIS — R2681 Unsteadiness on feet: Secondary | ICD-10-CM | POA: Diagnosis not present

## 2017-01-13 DIAGNOSIS — G894 Chronic pain syndrome: Secondary | ICD-10-CM | POA: Diagnosis not present

## 2017-01-13 DIAGNOSIS — M25552 Pain in left hip: Secondary | ICD-10-CM | POA: Diagnosis not present

## 2017-01-14 DIAGNOSIS — E1140 Type 2 diabetes mellitus with diabetic neuropathy, unspecified: Secondary | ICD-10-CM | POA: Diagnosis not present

## 2017-01-14 DIAGNOSIS — K7460 Unspecified cirrhosis of liver: Secondary | ICD-10-CM | POA: Diagnosis not present

## 2017-01-14 DIAGNOSIS — I10 Essential (primary) hypertension: Secondary | ICD-10-CM | POA: Diagnosis not present

## 2017-01-14 DIAGNOSIS — E8809 Other disorders of plasma-protein metabolism, not elsewhere classified: Secondary | ICD-10-CM | POA: Diagnosis not present

## 2017-01-14 DIAGNOSIS — J449 Chronic obstructive pulmonary disease, unspecified: Secondary | ICD-10-CM | POA: Diagnosis not present

## 2017-01-14 DIAGNOSIS — M6281 Muscle weakness (generalized): Secondary | ICD-10-CM | POA: Diagnosis not present

## 2017-01-15 DIAGNOSIS — E1140 Type 2 diabetes mellitus with diabetic neuropathy, unspecified: Secondary | ICD-10-CM | POA: Diagnosis not present

## 2017-01-15 DIAGNOSIS — I10 Essential (primary) hypertension: Secondary | ICD-10-CM | POA: Diagnosis not present

## 2017-01-15 DIAGNOSIS — M6281 Muscle weakness (generalized): Secondary | ICD-10-CM | POA: Diagnosis not present

## 2017-01-15 DIAGNOSIS — K7460 Unspecified cirrhosis of liver: Secondary | ICD-10-CM | POA: Diagnosis not present

## 2017-01-15 DIAGNOSIS — E8809 Other disorders of plasma-protein metabolism, not elsewhere classified: Secondary | ICD-10-CM | POA: Diagnosis not present

## 2017-01-15 DIAGNOSIS — J449 Chronic obstructive pulmonary disease, unspecified: Secondary | ICD-10-CM | POA: Diagnosis not present

## 2017-01-18 DIAGNOSIS — E1140 Type 2 diabetes mellitus with diabetic neuropathy, unspecified: Secondary | ICD-10-CM | POA: Diagnosis not present

## 2017-01-18 DIAGNOSIS — M6281 Muscle weakness (generalized): Secondary | ICD-10-CM | POA: Diagnosis not present

## 2017-01-18 DIAGNOSIS — I10 Essential (primary) hypertension: Secondary | ICD-10-CM | POA: Diagnosis not present

## 2017-01-18 DIAGNOSIS — K7460 Unspecified cirrhosis of liver: Secondary | ICD-10-CM | POA: Diagnosis not present

## 2017-01-18 DIAGNOSIS — J449 Chronic obstructive pulmonary disease, unspecified: Secondary | ICD-10-CM | POA: Diagnosis not present

## 2017-01-18 DIAGNOSIS — E8809 Other disorders of plasma-protein metabolism, not elsewhere classified: Secondary | ICD-10-CM | POA: Diagnosis not present

## 2017-01-20 DIAGNOSIS — I10 Essential (primary) hypertension: Secondary | ICD-10-CM | POA: Diagnosis not present

## 2017-01-20 DIAGNOSIS — M6281 Muscle weakness (generalized): Secondary | ICD-10-CM | POA: Diagnosis not present

## 2017-01-20 DIAGNOSIS — E8809 Other disorders of plasma-protein metabolism, not elsewhere classified: Secondary | ICD-10-CM | POA: Diagnosis not present

## 2017-01-20 DIAGNOSIS — E1140 Type 2 diabetes mellitus with diabetic neuropathy, unspecified: Secondary | ICD-10-CM | POA: Diagnosis not present

## 2017-01-20 DIAGNOSIS — K7460 Unspecified cirrhosis of liver: Secondary | ICD-10-CM | POA: Diagnosis not present

## 2017-01-20 DIAGNOSIS — J449 Chronic obstructive pulmonary disease, unspecified: Secondary | ICD-10-CM | POA: Diagnosis not present

## 2017-01-21 DIAGNOSIS — E8809 Other disorders of plasma-protein metabolism, not elsewhere classified: Secondary | ICD-10-CM | POA: Diagnosis not present

## 2017-01-21 DIAGNOSIS — K7460 Unspecified cirrhosis of liver: Secondary | ICD-10-CM | POA: Diagnosis not present

## 2017-01-21 DIAGNOSIS — M6281 Muscle weakness (generalized): Secondary | ICD-10-CM | POA: Diagnosis not present

## 2017-01-21 DIAGNOSIS — E1140 Type 2 diabetes mellitus with diabetic neuropathy, unspecified: Secondary | ICD-10-CM | POA: Diagnosis not present

## 2017-01-21 DIAGNOSIS — J449 Chronic obstructive pulmonary disease, unspecified: Secondary | ICD-10-CM | POA: Diagnosis not present

## 2017-01-21 DIAGNOSIS — I10 Essential (primary) hypertension: Secondary | ICD-10-CM | POA: Diagnosis not present

## 2017-01-24 DIAGNOSIS — I10 Essential (primary) hypertension: Secondary | ICD-10-CM | POA: Diagnosis not present

## 2017-01-24 DIAGNOSIS — K7460 Unspecified cirrhosis of liver: Secondary | ICD-10-CM | POA: Diagnosis not present

## 2017-01-24 DIAGNOSIS — M6281 Muscle weakness (generalized): Secondary | ICD-10-CM | POA: Diagnosis not present

## 2017-01-24 DIAGNOSIS — E1140 Type 2 diabetes mellitus with diabetic neuropathy, unspecified: Secondary | ICD-10-CM | POA: Diagnosis not present

## 2017-01-24 DIAGNOSIS — E8809 Other disorders of plasma-protein metabolism, not elsewhere classified: Secondary | ICD-10-CM | POA: Diagnosis not present

## 2017-01-24 DIAGNOSIS — J449 Chronic obstructive pulmonary disease, unspecified: Secondary | ICD-10-CM | POA: Diagnosis not present

## 2017-01-25 DIAGNOSIS — E1140 Type 2 diabetes mellitus with diabetic neuropathy, unspecified: Secondary | ICD-10-CM | POA: Diagnosis not present

## 2017-01-25 DIAGNOSIS — K7460 Unspecified cirrhosis of liver: Secondary | ICD-10-CM | POA: Diagnosis not present

## 2017-01-25 DIAGNOSIS — M6281 Muscle weakness (generalized): Secondary | ICD-10-CM | POA: Diagnosis not present

## 2017-01-25 DIAGNOSIS — E8809 Other disorders of plasma-protein metabolism, not elsewhere classified: Secondary | ICD-10-CM | POA: Diagnosis not present

## 2017-01-25 DIAGNOSIS — J449 Chronic obstructive pulmonary disease, unspecified: Secondary | ICD-10-CM | POA: Diagnosis not present

## 2017-01-25 DIAGNOSIS — I10 Essential (primary) hypertension: Secondary | ICD-10-CM | POA: Diagnosis not present

## 2017-01-28 DIAGNOSIS — M6281 Muscle weakness (generalized): Secondary | ICD-10-CM | POA: Diagnosis not present

## 2017-01-28 DIAGNOSIS — E1140 Type 2 diabetes mellitus with diabetic neuropathy, unspecified: Secondary | ICD-10-CM | POA: Diagnosis not present

## 2017-01-28 DIAGNOSIS — J449 Chronic obstructive pulmonary disease, unspecified: Secondary | ICD-10-CM | POA: Diagnosis not present

## 2017-01-28 DIAGNOSIS — E8809 Other disorders of plasma-protein metabolism, not elsewhere classified: Secondary | ICD-10-CM | POA: Diagnosis not present

## 2017-01-28 DIAGNOSIS — I10 Essential (primary) hypertension: Secondary | ICD-10-CM | POA: Diagnosis not present

## 2017-01-28 DIAGNOSIS — K7460 Unspecified cirrhosis of liver: Secondary | ICD-10-CM | POA: Diagnosis not present

## 2017-02-01 DIAGNOSIS — M6281 Muscle weakness (generalized): Secondary | ICD-10-CM | POA: Diagnosis not present

## 2017-02-01 DIAGNOSIS — I10 Essential (primary) hypertension: Secondary | ICD-10-CM | POA: Diagnosis not present

## 2017-02-01 DIAGNOSIS — J449 Chronic obstructive pulmonary disease, unspecified: Secondary | ICD-10-CM | POA: Diagnosis not present

## 2017-02-01 DIAGNOSIS — K7460 Unspecified cirrhosis of liver: Secondary | ICD-10-CM | POA: Diagnosis not present

## 2017-02-01 DIAGNOSIS — E8809 Other disorders of plasma-protein metabolism, not elsewhere classified: Secondary | ICD-10-CM | POA: Diagnosis not present

## 2017-02-01 DIAGNOSIS — E1140 Type 2 diabetes mellitus with diabetic neuropathy, unspecified: Secondary | ICD-10-CM | POA: Diagnosis not present

## 2017-02-04 DIAGNOSIS — K7460 Unspecified cirrhosis of liver: Secondary | ICD-10-CM | POA: Diagnosis not present

## 2017-02-04 DIAGNOSIS — E1140 Type 2 diabetes mellitus with diabetic neuropathy, unspecified: Secondary | ICD-10-CM | POA: Diagnosis not present

## 2017-02-04 DIAGNOSIS — J449 Chronic obstructive pulmonary disease, unspecified: Secondary | ICD-10-CM | POA: Diagnosis not present

## 2017-02-04 DIAGNOSIS — M6281 Muscle weakness (generalized): Secondary | ICD-10-CM | POA: Diagnosis not present

## 2017-02-04 DIAGNOSIS — E8809 Other disorders of plasma-protein metabolism, not elsewhere classified: Secondary | ICD-10-CM | POA: Diagnosis not present

## 2017-02-04 DIAGNOSIS — I10 Essential (primary) hypertension: Secondary | ICD-10-CM | POA: Diagnosis not present

## 2017-02-08 DIAGNOSIS — K7460 Unspecified cirrhosis of liver: Secondary | ICD-10-CM | POA: Diagnosis not present

## 2017-02-08 DIAGNOSIS — J449 Chronic obstructive pulmonary disease, unspecified: Secondary | ICD-10-CM | POA: Diagnosis not present

## 2017-02-08 DIAGNOSIS — M6281 Muscle weakness (generalized): Secondary | ICD-10-CM | POA: Diagnosis not present

## 2017-02-08 DIAGNOSIS — I10 Essential (primary) hypertension: Secondary | ICD-10-CM | POA: Diagnosis not present

## 2017-02-08 DIAGNOSIS — E8809 Other disorders of plasma-protein metabolism, not elsewhere classified: Secondary | ICD-10-CM | POA: Diagnosis not present

## 2017-02-08 DIAGNOSIS — E1140 Type 2 diabetes mellitus with diabetic neuropathy, unspecified: Secondary | ICD-10-CM | POA: Diagnosis not present

## 2017-02-09 DIAGNOSIS — M6281 Muscle weakness (generalized): Secondary | ICD-10-CM | POA: Diagnosis not present

## 2017-02-09 DIAGNOSIS — K7460 Unspecified cirrhosis of liver: Secondary | ICD-10-CM | POA: Diagnosis not present

## 2017-02-09 DIAGNOSIS — J449 Chronic obstructive pulmonary disease, unspecified: Secondary | ICD-10-CM | POA: Diagnosis not present

## 2017-02-09 DIAGNOSIS — I10 Essential (primary) hypertension: Secondary | ICD-10-CM | POA: Diagnosis not present

## 2017-02-09 DIAGNOSIS — E8809 Other disorders of plasma-protein metabolism, not elsewhere classified: Secondary | ICD-10-CM | POA: Diagnosis not present

## 2017-02-09 DIAGNOSIS — E1140 Type 2 diabetes mellitus with diabetic neuropathy, unspecified: Secondary | ICD-10-CM | POA: Diagnosis not present

## 2017-02-10 DIAGNOSIS — E8809 Other disorders of plasma-protein metabolism, not elsewhere classified: Secondary | ICD-10-CM | POA: Diagnosis not present

## 2017-02-10 DIAGNOSIS — E1140 Type 2 diabetes mellitus with diabetic neuropathy, unspecified: Secondary | ICD-10-CM | POA: Diagnosis not present

## 2017-02-10 DIAGNOSIS — J449 Chronic obstructive pulmonary disease, unspecified: Secondary | ICD-10-CM | POA: Diagnosis not present

## 2017-02-10 DIAGNOSIS — K7460 Unspecified cirrhosis of liver: Secondary | ICD-10-CM | POA: Diagnosis not present

## 2017-02-10 DIAGNOSIS — M6281 Muscle weakness (generalized): Secondary | ICD-10-CM | POA: Diagnosis not present

## 2017-02-10 DIAGNOSIS — I10 Essential (primary) hypertension: Secondary | ICD-10-CM | POA: Diagnosis not present

## 2017-02-11 DIAGNOSIS — Z79891 Long term (current) use of opiate analgesic: Secondary | ICD-10-CM | POA: Diagnosis not present

## 2017-02-11 DIAGNOSIS — M6250 Muscle wasting and atrophy, not elsewhere classified, unspecified site: Secondary | ICD-10-CM | POA: Diagnosis not present

## 2017-02-11 DIAGNOSIS — K861 Other chronic pancreatitis: Secondary | ICD-10-CM | POA: Diagnosis not present

## 2017-02-11 DIAGNOSIS — G894 Chronic pain syndrome: Secondary | ICD-10-CM | POA: Diagnosis not present

## 2017-02-11 DIAGNOSIS — R2681 Unsteadiness on feet: Secondary | ICD-10-CM | POA: Diagnosis not present

## 2017-02-11 DIAGNOSIS — R1011 Right upper quadrant pain: Secondary | ICD-10-CM | POA: Diagnosis not present

## 2017-02-12 DIAGNOSIS — M6281 Muscle weakness (generalized): Secondary | ICD-10-CM | POA: Diagnosis not present

## 2017-02-12 DIAGNOSIS — K7460 Unspecified cirrhosis of liver: Secondary | ICD-10-CM | POA: Diagnosis not present

## 2017-02-12 DIAGNOSIS — I10 Essential (primary) hypertension: Secondary | ICD-10-CM | POA: Diagnosis not present

## 2017-02-12 DIAGNOSIS — E8809 Other disorders of plasma-protein metabolism, not elsewhere classified: Secondary | ICD-10-CM | POA: Diagnosis not present

## 2017-02-12 DIAGNOSIS — J449 Chronic obstructive pulmonary disease, unspecified: Secondary | ICD-10-CM | POA: Diagnosis not present

## 2017-02-12 DIAGNOSIS — E1140 Type 2 diabetes mellitus with diabetic neuropathy, unspecified: Secondary | ICD-10-CM | POA: Diagnosis not present

## 2017-02-14 DIAGNOSIS — K7460 Unspecified cirrhosis of liver: Secondary | ICD-10-CM | POA: Diagnosis not present

## 2017-02-14 DIAGNOSIS — E1140 Type 2 diabetes mellitus with diabetic neuropathy, unspecified: Secondary | ICD-10-CM | POA: Diagnosis not present

## 2017-02-14 DIAGNOSIS — J449 Chronic obstructive pulmonary disease, unspecified: Secondary | ICD-10-CM | POA: Diagnosis not present

## 2017-02-14 DIAGNOSIS — E8809 Other disorders of plasma-protein metabolism, not elsewhere classified: Secondary | ICD-10-CM | POA: Diagnosis not present

## 2017-02-14 DIAGNOSIS — M6281 Muscle weakness (generalized): Secondary | ICD-10-CM | POA: Diagnosis not present

## 2017-02-14 DIAGNOSIS — I10 Essential (primary) hypertension: Secondary | ICD-10-CM | POA: Diagnosis not present

## 2017-02-15 ENCOUNTER — Telehealth: Payer: Self-pay | Admitting: "Endocrinology

## 2017-02-15 DIAGNOSIS — E8809 Other disorders of plasma-protein metabolism, not elsewhere classified: Secondary | ICD-10-CM | POA: Diagnosis not present

## 2017-02-15 DIAGNOSIS — M6281 Muscle weakness (generalized): Secondary | ICD-10-CM | POA: Diagnosis not present

## 2017-02-15 DIAGNOSIS — I10 Essential (primary) hypertension: Secondary | ICD-10-CM | POA: Diagnosis not present

## 2017-02-15 DIAGNOSIS — E1140 Type 2 diabetes mellitus with diabetic neuropathy, unspecified: Secondary | ICD-10-CM | POA: Diagnosis not present

## 2017-02-15 DIAGNOSIS — K7460 Unspecified cirrhosis of liver: Secondary | ICD-10-CM | POA: Diagnosis not present

## 2017-02-15 DIAGNOSIS — J449 Chronic obstructive pulmonary disease, unspecified: Secondary | ICD-10-CM | POA: Diagnosis not present

## 2017-02-15 NOTE — Telephone Encounter (Signed)
Mr. [facility] Nurse ([facility]) called and stated that Mr. Peelers sugar was registering over 600 and asking what Dr. Dorris Fetch would like for him to do, Dr. Dorris Fetch stated that he should go to the ER immediately due to him not being compliant here at the office, he hasn't been seen here since December 2017. Mr. [facility] Nurse stated that Mr. Taddei has refused to go to the ER at this present time and his wife has given him quick release insulin at this time.

## 2017-02-16 NOTE — Telephone Encounter (Signed)
This patient displayed serious non compliance, and continues to miss his appointments. It is impossible to give him a treatment plan based only on one high reading. He needed urgent medical help to stabilize and resumption of f/u  for long term care. It is unfortunate that he declined to go to ER.

## 2017-02-17 DIAGNOSIS — K7460 Unspecified cirrhosis of liver: Secondary | ICD-10-CM | POA: Diagnosis not present

## 2017-02-17 DIAGNOSIS — M6281 Muscle weakness (generalized): Secondary | ICD-10-CM | POA: Diagnosis not present

## 2017-02-17 DIAGNOSIS — J449 Chronic obstructive pulmonary disease, unspecified: Secondary | ICD-10-CM | POA: Diagnosis not present

## 2017-02-17 DIAGNOSIS — E8809 Other disorders of plasma-protein metabolism, not elsewhere classified: Secondary | ICD-10-CM | POA: Diagnosis not present

## 2017-02-17 DIAGNOSIS — E1140 Type 2 diabetes mellitus with diabetic neuropathy, unspecified: Secondary | ICD-10-CM | POA: Diagnosis not present

## 2017-02-17 DIAGNOSIS — I10 Essential (primary) hypertension: Secondary | ICD-10-CM | POA: Diagnosis not present

## 2017-02-18 DIAGNOSIS — M6281 Muscle weakness (generalized): Secondary | ICD-10-CM | POA: Diagnosis not present

## 2017-02-18 DIAGNOSIS — K7460 Unspecified cirrhosis of liver: Secondary | ICD-10-CM | POA: Diagnosis not present

## 2017-02-18 DIAGNOSIS — I10 Essential (primary) hypertension: Secondary | ICD-10-CM | POA: Diagnosis not present

## 2017-02-18 DIAGNOSIS — J449 Chronic obstructive pulmonary disease, unspecified: Secondary | ICD-10-CM | POA: Diagnosis not present

## 2017-02-18 DIAGNOSIS — E1140 Type 2 diabetes mellitus with diabetic neuropathy, unspecified: Secondary | ICD-10-CM | POA: Diagnosis not present

## 2017-02-18 DIAGNOSIS — E8809 Other disorders of plasma-protein metabolism, not elsewhere classified: Secondary | ICD-10-CM | POA: Diagnosis not present

## 2017-02-20 ENCOUNTER — Other Ambulatory Visit: Payer: Self-pay | Admitting: "Endocrinology

## 2017-02-20 MED ORDER — GLUCOSE BLOOD VI STRP: ORAL_STRIP | 5 refills | Status: DC

## 2017-02-21 ENCOUNTER — Other Ambulatory Visit: Payer: Self-pay

## 2017-02-21 DIAGNOSIS — M6281 Muscle weakness (generalized): Secondary | ICD-10-CM | POA: Diagnosis not present

## 2017-02-21 DIAGNOSIS — E8809 Other disorders of plasma-protein metabolism, not elsewhere classified: Secondary | ICD-10-CM | POA: Diagnosis not present

## 2017-02-21 DIAGNOSIS — J449 Chronic obstructive pulmonary disease, unspecified: Secondary | ICD-10-CM | POA: Diagnosis not present

## 2017-02-21 DIAGNOSIS — E1140 Type 2 diabetes mellitus with diabetic neuropathy, unspecified: Secondary | ICD-10-CM | POA: Diagnosis not present

## 2017-02-21 DIAGNOSIS — E1142 Type 2 diabetes mellitus with diabetic polyneuropathy: Secondary | ICD-10-CM | POA: Diagnosis not present

## 2017-02-21 DIAGNOSIS — I10 Essential (primary) hypertension: Secondary | ICD-10-CM | POA: Diagnosis not present

## 2017-02-21 DIAGNOSIS — K7460 Unspecified cirrhosis of liver: Secondary | ICD-10-CM | POA: Diagnosis not present

## 2017-02-21 MED ORDER — GLUCOSE BLOOD VI STRP: ORAL_STRIP | 5 refills | Status: AC

## 2017-02-23 DIAGNOSIS — E8809 Other disorders of plasma-protein metabolism, not elsewhere classified: Secondary | ICD-10-CM | POA: Diagnosis not present

## 2017-02-23 DIAGNOSIS — M6281 Muscle weakness (generalized): Secondary | ICD-10-CM | POA: Diagnosis not present

## 2017-02-23 DIAGNOSIS — I10 Essential (primary) hypertension: Secondary | ICD-10-CM | POA: Diagnosis not present

## 2017-02-23 DIAGNOSIS — K7460 Unspecified cirrhosis of liver: Secondary | ICD-10-CM | POA: Diagnosis not present

## 2017-02-23 DIAGNOSIS — E1140 Type 2 diabetes mellitus with diabetic neuropathy, unspecified: Secondary | ICD-10-CM | POA: Diagnosis not present

## 2017-02-23 DIAGNOSIS — J449 Chronic obstructive pulmonary disease, unspecified: Secondary | ICD-10-CM | POA: Diagnosis not present

## 2017-02-24 DIAGNOSIS — F339 Major depressive disorder, recurrent, unspecified: Secondary | ICD-10-CM | POA: Diagnosis not present

## 2017-02-24 DIAGNOSIS — E118 Type 2 diabetes mellitus with unspecified complications: Secondary | ICD-10-CM | POA: Diagnosis not present

## 2017-02-24 DIAGNOSIS — E440 Moderate protein-calorie malnutrition: Secondary | ICD-10-CM | POA: Diagnosis not present

## 2017-02-24 DIAGNOSIS — R5383 Other fatigue: Secondary | ICD-10-CM | POA: Diagnosis not present

## 2017-02-24 DIAGNOSIS — R52 Pain, unspecified: Secondary | ICD-10-CM | POA: Diagnosis not present

## 2017-02-24 DIAGNOSIS — R531 Weakness: Secondary | ICD-10-CM | POA: Diagnosis not present

## 2017-02-24 DIAGNOSIS — I10 Essential (primary) hypertension: Secondary | ICD-10-CM | POA: Diagnosis not present

## 2017-02-24 DIAGNOSIS — K7469 Other cirrhosis of liver: Secondary | ICD-10-CM | POA: Diagnosis not present

## 2017-02-24 DIAGNOSIS — K861 Other chronic pancreatitis: Secondary | ICD-10-CM | POA: Diagnosis not present

## 2017-02-24 DIAGNOSIS — E785 Hyperlipidemia, unspecified: Secondary | ICD-10-CM | POA: Diagnosis not present

## 2017-02-25 DIAGNOSIS — I10 Essential (primary) hypertension: Secondary | ICD-10-CM | POA: Diagnosis not present

## 2017-02-25 DIAGNOSIS — K861 Other chronic pancreatitis: Secondary | ICD-10-CM | POA: Diagnosis not present

## 2017-02-25 DIAGNOSIS — K7469 Other cirrhosis of liver: Secondary | ICD-10-CM | POA: Diagnosis not present

## 2017-02-25 DIAGNOSIS — R5383 Other fatigue: Secondary | ICD-10-CM | POA: Diagnosis not present

## 2017-02-25 DIAGNOSIS — E118 Type 2 diabetes mellitus with unspecified complications: Secondary | ICD-10-CM | POA: Diagnosis not present

## 2017-02-25 DIAGNOSIS — R531 Weakness: Secondary | ICD-10-CM | POA: Diagnosis not present

## 2017-03-01 DIAGNOSIS — E118 Type 2 diabetes mellitus with unspecified complications: Secondary | ICD-10-CM | POA: Diagnosis not present

## 2017-03-01 DIAGNOSIS — R531 Weakness: Secondary | ICD-10-CM | POA: Diagnosis not present

## 2017-03-01 DIAGNOSIS — I10 Essential (primary) hypertension: Secondary | ICD-10-CM | POA: Diagnosis not present

## 2017-03-01 DIAGNOSIS — R5383 Other fatigue: Secondary | ICD-10-CM | POA: Diagnosis not present

## 2017-03-01 DIAGNOSIS — K861 Other chronic pancreatitis: Secondary | ICD-10-CM | POA: Diagnosis not present

## 2017-03-01 DIAGNOSIS — K7469 Other cirrhosis of liver: Secondary | ICD-10-CM | POA: Diagnosis not present

## 2017-03-03 DIAGNOSIS — E118 Type 2 diabetes mellitus with unspecified complications: Secondary | ICD-10-CM | POA: Diagnosis not present

## 2017-03-03 DIAGNOSIS — R531 Weakness: Secondary | ICD-10-CM | POA: Diagnosis not present

## 2017-03-03 DIAGNOSIS — K7469 Other cirrhosis of liver: Secondary | ICD-10-CM | POA: Diagnosis not present

## 2017-03-03 DIAGNOSIS — R5383 Other fatigue: Secondary | ICD-10-CM | POA: Diagnosis not present

## 2017-03-03 DIAGNOSIS — I10 Essential (primary) hypertension: Secondary | ICD-10-CM | POA: Diagnosis not present

## 2017-03-03 DIAGNOSIS — K861 Other chronic pancreatitis: Secondary | ICD-10-CM | POA: Diagnosis not present

## 2017-03-04 DIAGNOSIS — K7469 Other cirrhosis of liver: Secondary | ICD-10-CM | POA: Diagnosis not present

## 2017-03-04 DIAGNOSIS — E440 Moderate protein-calorie malnutrition: Secondary | ICD-10-CM | POA: Diagnosis not present

## 2017-03-04 DIAGNOSIS — E785 Hyperlipidemia, unspecified: Secondary | ICD-10-CM | POA: Diagnosis not present

## 2017-03-04 DIAGNOSIS — R531 Weakness: Secondary | ICD-10-CM | POA: Diagnosis not present

## 2017-03-04 DIAGNOSIS — R52 Pain, unspecified: Secondary | ICD-10-CM | POA: Diagnosis not present

## 2017-03-04 DIAGNOSIS — K861 Other chronic pancreatitis: Secondary | ICD-10-CM | POA: Diagnosis not present

## 2017-03-04 DIAGNOSIS — F339 Major depressive disorder, recurrent, unspecified: Secondary | ICD-10-CM | POA: Diagnosis not present

## 2017-03-04 DIAGNOSIS — R5383 Other fatigue: Secondary | ICD-10-CM | POA: Diagnosis not present

## 2017-03-04 DIAGNOSIS — I10 Essential (primary) hypertension: Secondary | ICD-10-CM | POA: Diagnosis not present

## 2017-03-04 DIAGNOSIS — E118 Type 2 diabetes mellitus with unspecified complications: Secondary | ICD-10-CM | POA: Diagnosis not present

## 2017-03-05 DIAGNOSIS — K7469 Other cirrhosis of liver: Secondary | ICD-10-CM | POA: Diagnosis not present

## 2017-03-05 DIAGNOSIS — R5383 Other fatigue: Secondary | ICD-10-CM | POA: Diagnosis not present

## 2017-03-05 DIAGNOSIS — I10 Essential (primary) hypertension: Secondary | ICD-10-CM | POA: Diagnosis not present

## 2017-03-05 DIAGNOSIS — K861 Other chronic pancreatitis: Secondary | ICD-10-CM | POA: Diagnosis not present

## 2017-03-05 DIAGNOSIS — E118 Type 2 diabetes mellitus with unspecified complications: Secondary | ICD-10-CM | POA: Diagnosis not present

## 2017-03-05 DIAGNOSIS — R531 Weakness: Secondary | ICD-10-CM | POA: Diagnosis not present

## 2017-03-06 DIAGNOSIS — R531 Weakness: Secondary | ICD-10-CM | POA: Diagnosis not present

## 2017-03-06 DIAGNOSIS — K7469 Other cirrhosis of liver: Secondary | ICD-10-CM | POA: Diagnosis not present

## 2017-03-06 DIAGNOSIS — K861 Other chronic pancreatitis: Secondary | ICD-10-CM | POA: Diagnosis not present

## 2017-03-06 DIAGNOSIS — I10 Essential (primary) hypertension: Secondary | ICD-10-CM | POA: Diagnosis not present

## 2017-03-06 DIAGNOSIS — E118 Type 2 diabetes mellitus with unspecified complications: Secondary | ICD-10-CM | POA: Diagnosis not present

## 2017-03-06 DIAGNOSIS — R5383 Other fatigue: Secondary | ICD-10-CM | POA: Diagnosis not present

## 2017-03-07 DIAGNOSIS — K861 Other chronic pancreatitis: Secondary | ICD-10-CM | POA: Diagnosis not present

## 2017-03-07 DIAGNOSIS — E118 Type 2 diabetes mellitus with unspecified complications: Secondary | ICD-10-CM | POA: Diagnosis not present

## 2017-03-07 DIAGNOSIS — R531 Weakness: Secondary | ICD-10-CM | POA: Diagnosis not present

## 2017-03-07 DIAGNOSIS — R5383 Other fatigue: Secondary | ICD-10-CM | POA: Diagnosis not present

## 2017-03-07 DIAGNOSIS — I10 Essential (primary) hypertension: Secondary | ICD-10-CM | POA: Diagnosis not present

## 2017-03-07 DIAGNOSIS — K7469 Other cirrhosis of liver: Secondary | ICD-10-CM | POA: Diagnosis not present

## 2017-03-08 DIAGNOSIS — K7469 Other cirrhosis of liver: Secondary | ICD-10-CM | POA: Diagnosis not present

## 2017-03-08 DIAGNOSIS — I10 Essential (primary) hypertension: Secondary | ICD-10-CM | POA: Diagnosis not present

## 2017-03-08 DIAGNOSIS — R531 Weakness: Secondary | ICD-10-CM | POA: Diagnosis not present

## 2017-03-08 DIAGNOSIS — R5383 Other fatigue: Secondary | ICD-10-CM | POA: Diagnosis not present

## 2017-03-08 DIAGNOSIS — K861 Other chronic pancreatitis: Secondary | ICD-10-CM | POA: Diagnosis not present

## 2017-03-08 DIAGNOSIS — E118 Type 2 diabetes mellitus with unspecified complications: Secondary | ICD-10-CM | POA: Diagnosis not present

## 2017-03-09 DIAGNOSIS — K7469 Other cirrhosis of liver: Secondary | ICD-10-CM | POA: Diagnosis not present

## 2017-03-09 DIAGNOSIS — R531 Weakness: Secondary | ICD-10-CM | POA: Diagnosis not present

## 2017-03-09 DIAGNOSIS — E118 Type 2 diabetes mellitus with unspecified complications: Secondary | ICD-10-CM | POA: Diagnosis not present

## 2017-03-09 DIAGNOSIS — I10 Essential (primary) hypertension: Secondary | ICD-10-CM | POA: Diagnosis not present

## 2017-03-09 DIAGNOSIS — R5383 Other fatigue: Secondary | ICD-10-CM | POA: Diagnosis not present

## 2017-03-09 DIAGNOSIS — K861 Other chronic pancreatitis: Secondary | ICD-10-CM | POA: Diagnosis not present

## 2017-04-03 DEATH — deceased

## 2017-10-30 IMAGING — DX DG KNEE COMPLETE 4+V*L*
4 series · 4 of 4 positions shown · non-contrast
Comparison: No priors.

CLINICAL DATA: 50-year-old male with history of bilateral knee
pain. Multiple falls in the last several days.

EXAM:
LEFT KNEE - COMPLETE 4+ VIEW

[knee ap]
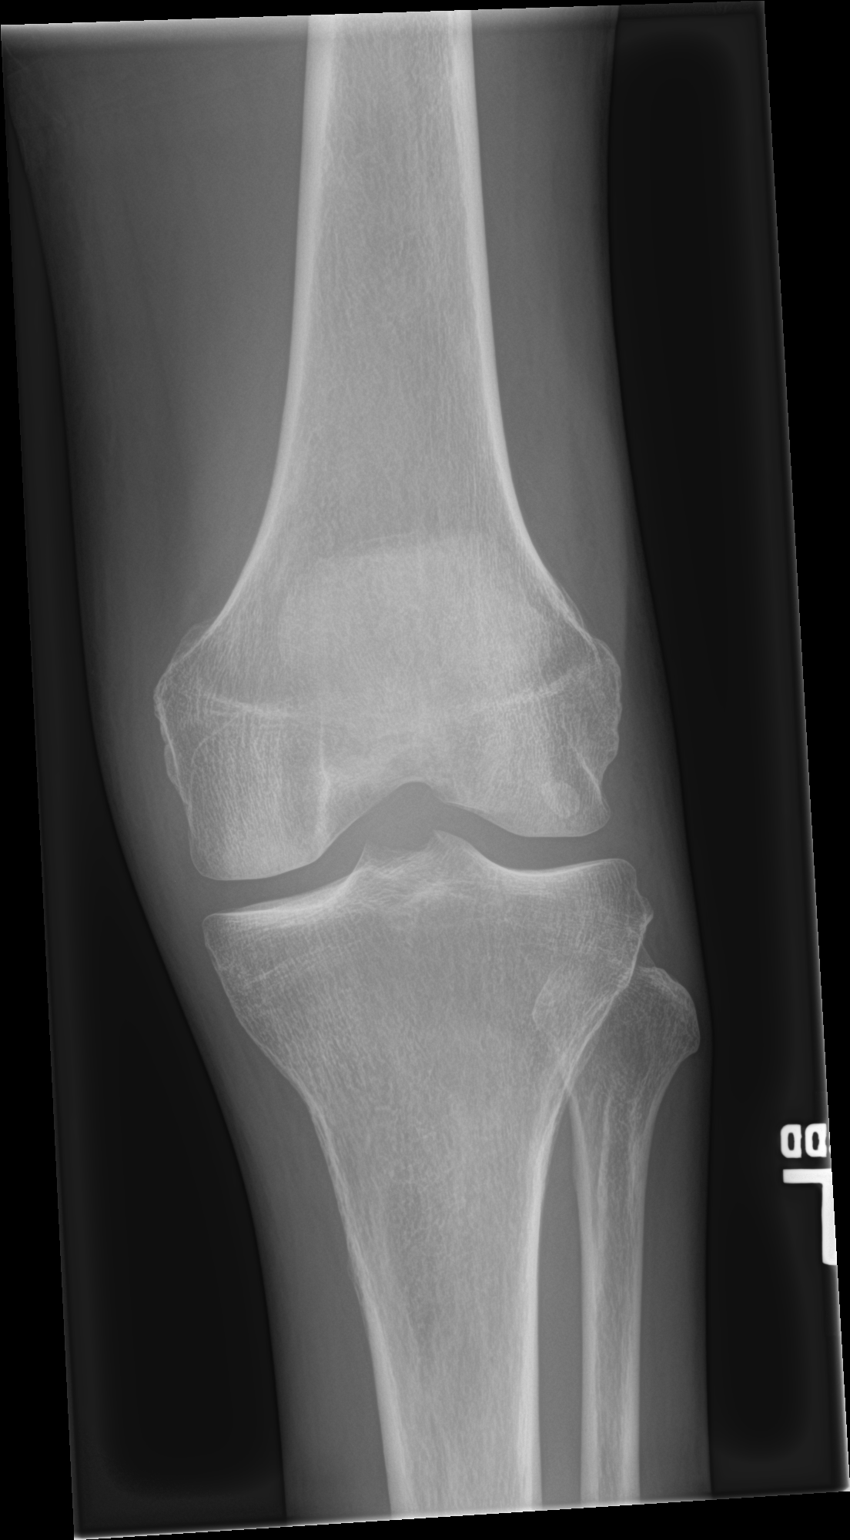

[tunnel]
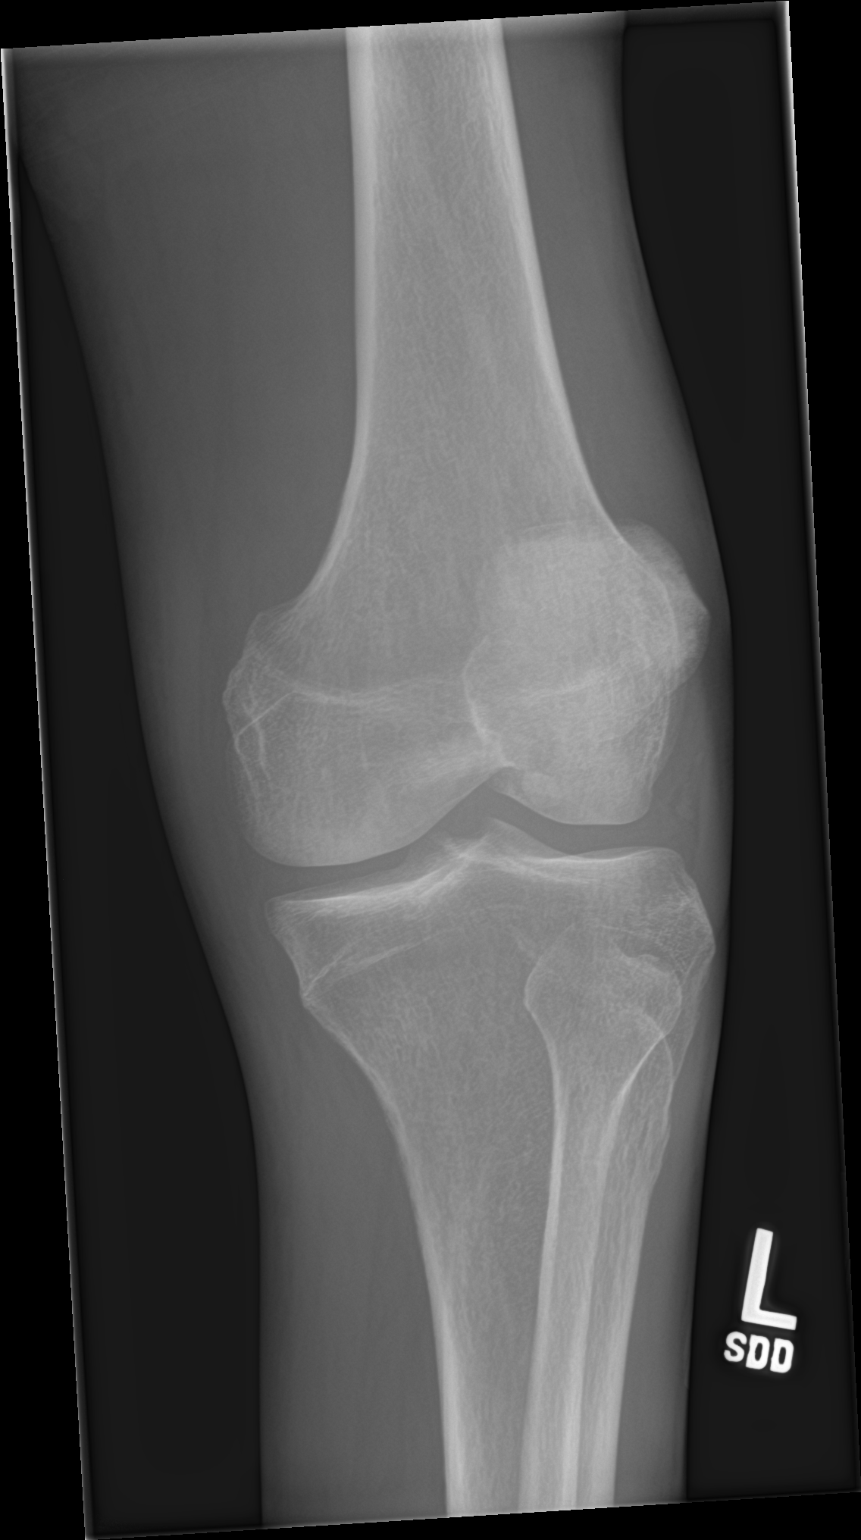

[knee lat]
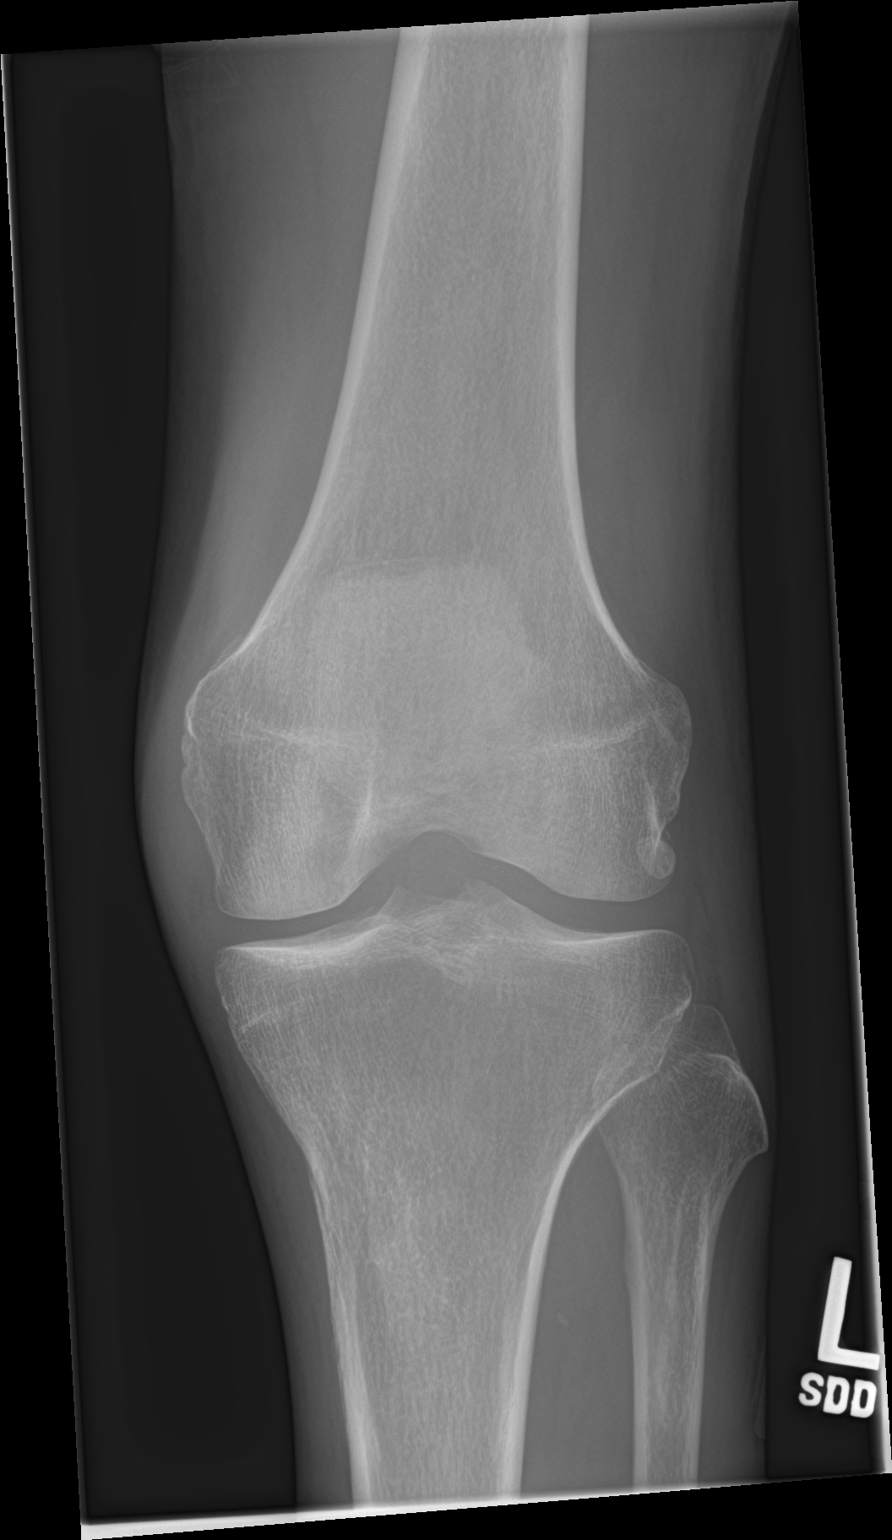

[knee sunrise]
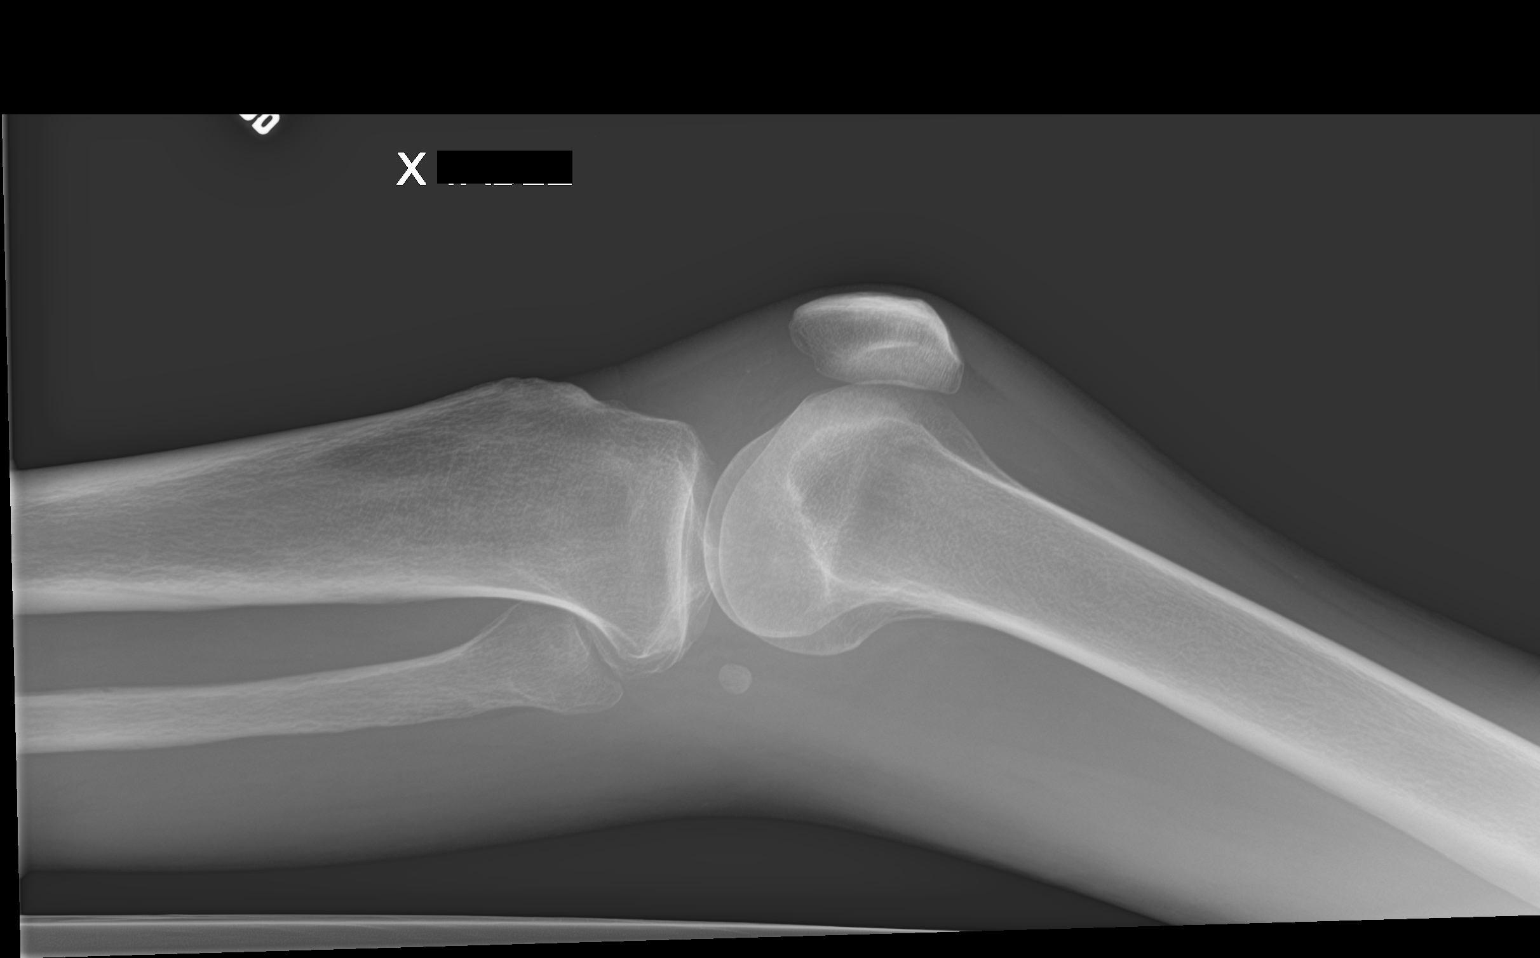

[4 of 4 positions shown; findings below may reference images not displayed]

FINDINGS: No evidence of fracture, dislocation, or joint effusion. No evidence
of arthropathy or other focal bone abnormality. Soft tissues are
unremarkable.
IMPRESSION: Negative.

## 2018-01-28 IMAGING — US US ABDOMEN COMPLETE
1 series · 13 of 25 positions shown · non-contrast
Comparison: Abdomen ultrasound 09/03/2016. CT Abdomen and Pelvis
01/01/2011.

CLINICAL DATA: 50-year-old male. Chronic calcific pancreatitis.
Pancreatic insufficiency. Prior cholecystectomy. Initial encounter.

EXAM:
ABDOMEN ULTRASOUND COMPLETE

[Series 1: us abdomen complete · 0.24mm/px · 13 of 103 slices shown]
[im 1/103]
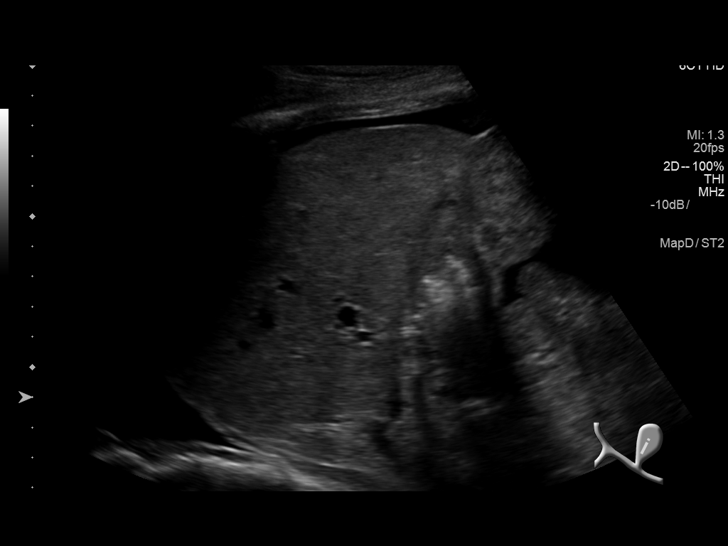
[im 9/103]
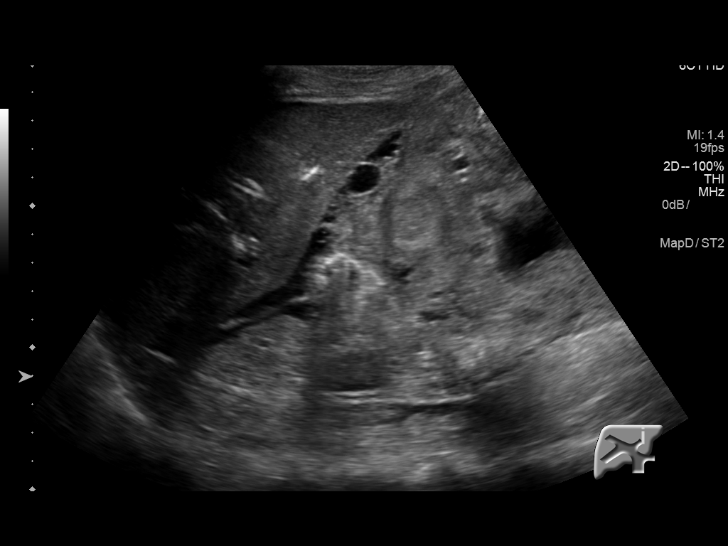
[im 18/103]
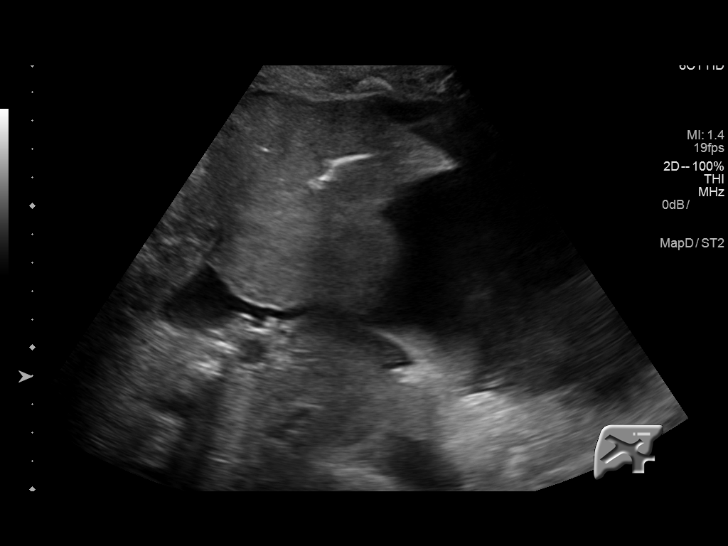
[im 26/103]
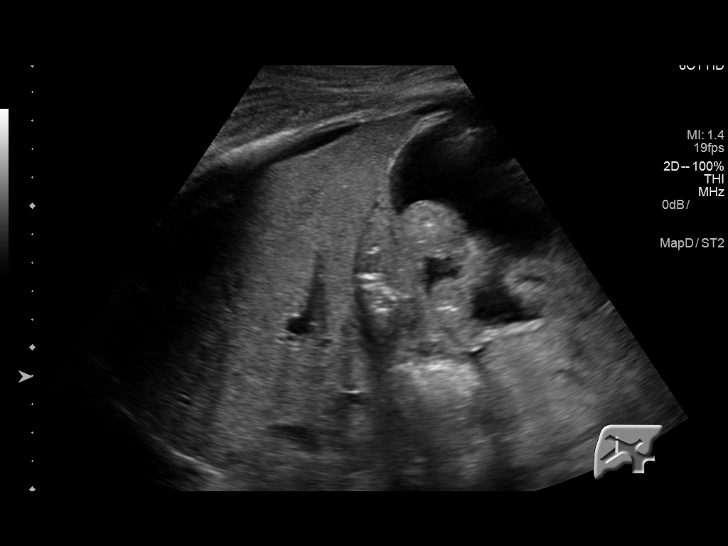
[im 35/103]
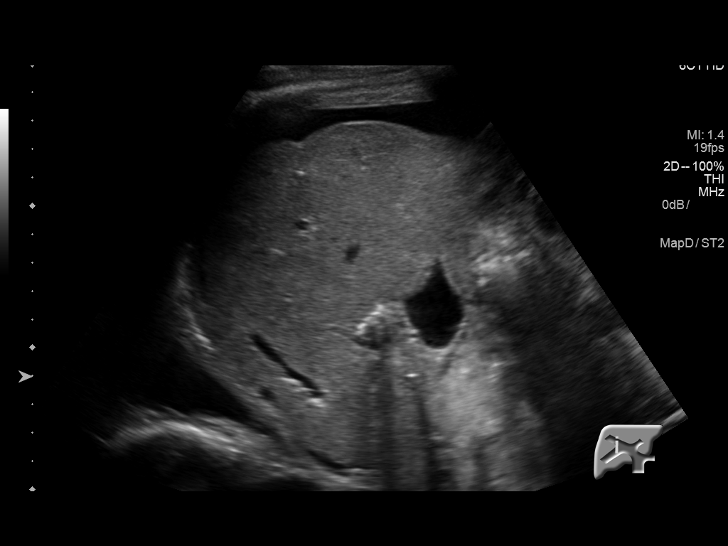
[im 43/103]
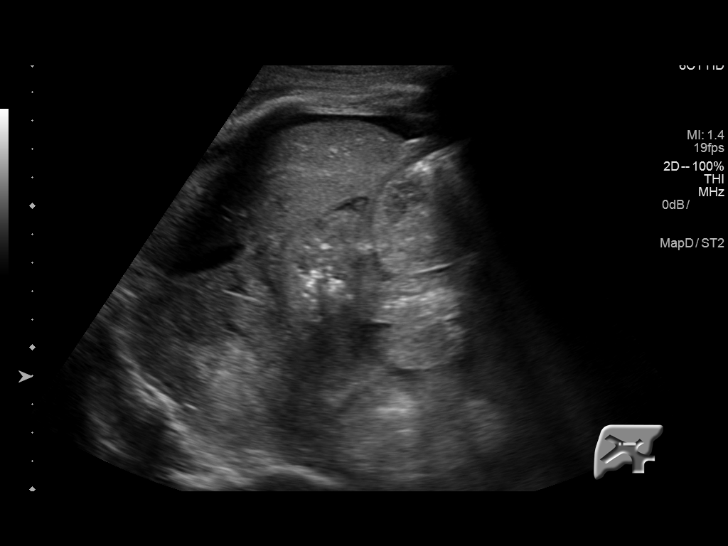
[im 52/103]
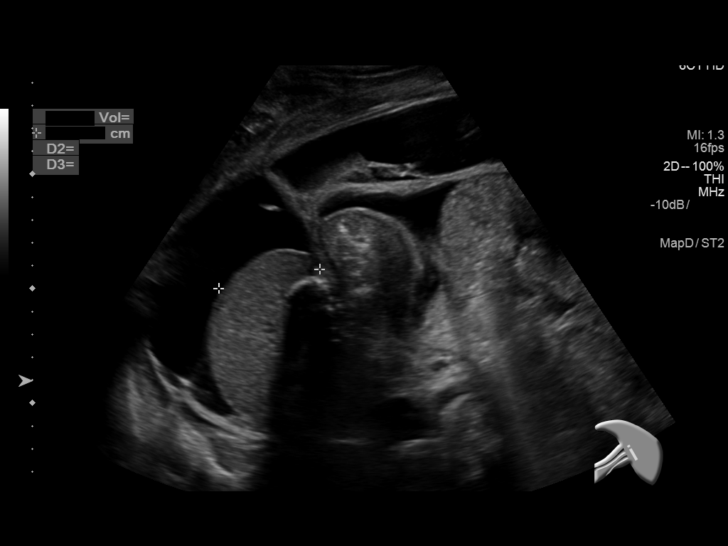
[im 60/103]
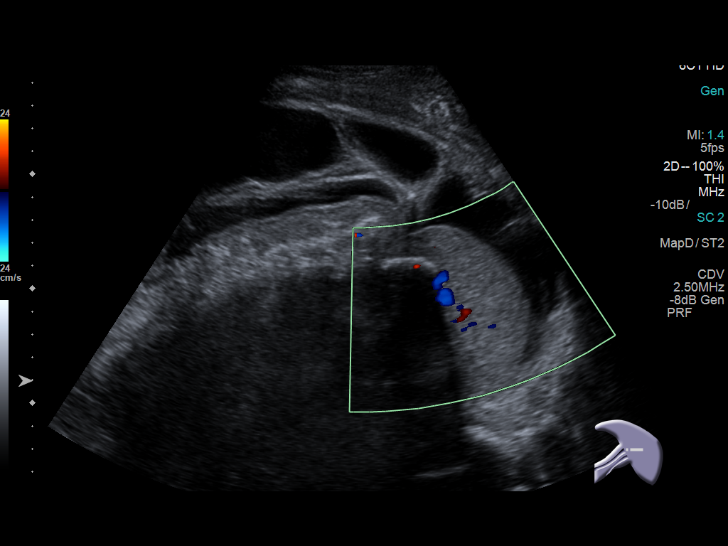
[im 69/103]
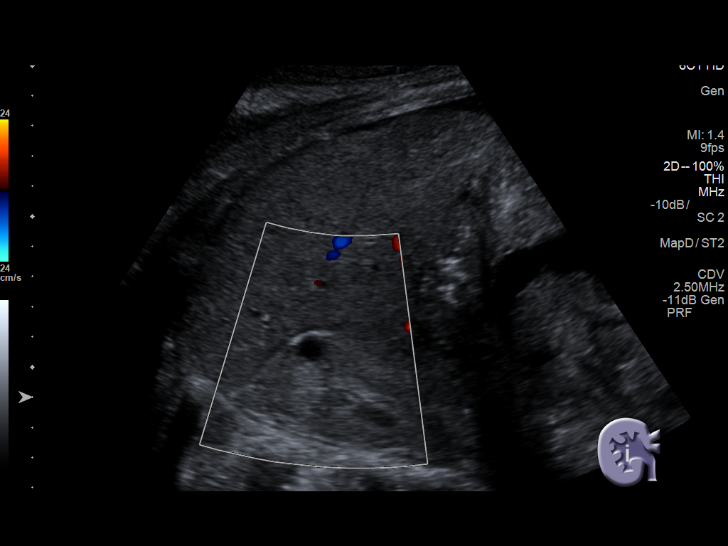
[im 77/103]
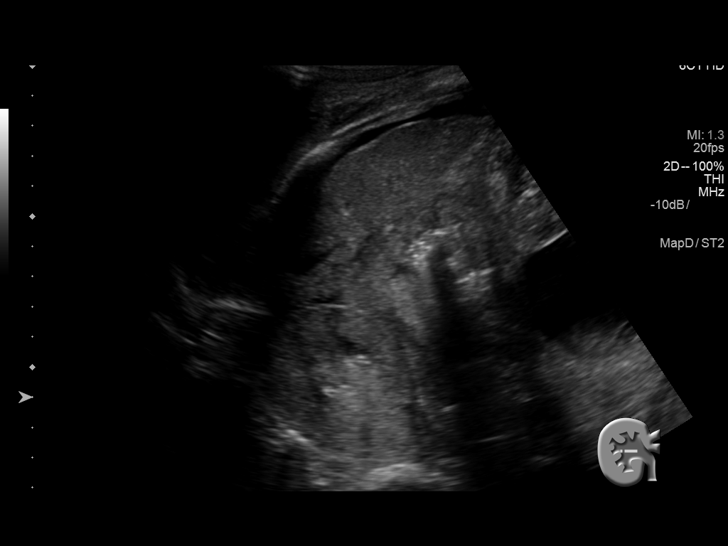
[im 86/103]
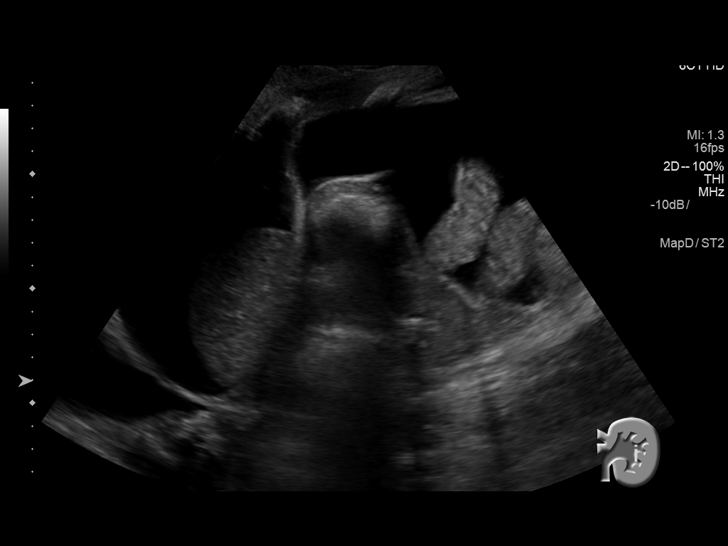
[im 94/103]
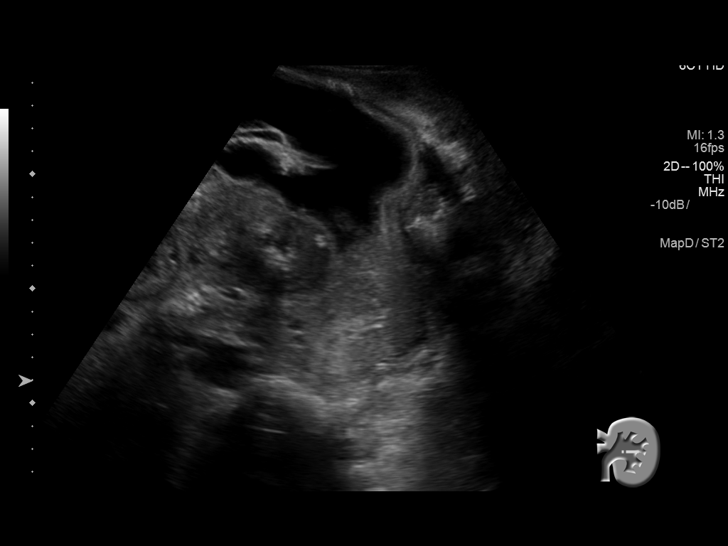
[im 103/103]
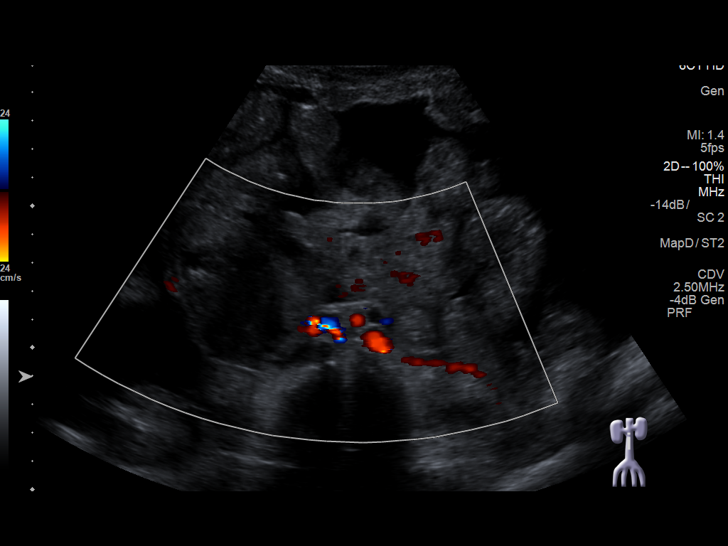

[13 of 25 positions shown; findings below may reference images not displayed]

FINDINGS: Gallbladder: Surgically absent

Common bile duct: Probable pneumobilia, as seen on the 6806 CT. CBD
size estimated at 2 mm. No intra or extrahepatic biliary ductal
dilatation is evident.

Liver: Nodular liver contour (image 35). Suggestion of generalized
decreased liver volume. No discrete liver lesion.

IVC: No abnormality visualized.

Pancreas: Poorly visualized.

Spleen: Normal splenic size and echogenicity.  4.5 cm in length.

Right Kidney: Length: 10.7 cm. Increased cortical echogenicity
(image 64). No hydronephrosis or right renal mass. There is a simple
appearing 1.3 cm upper pole cyst.

Left Kidney: Length: 11.1 cm. Not as well visualized as the right.
No hydronephrosis or left renal mass.

Abdominal aorta: Evidence of the Calcified aortic atherosclerosis
seen by CT. No abdominal aortic aneurysm identified.

Other findings: Moderate volume ascites throughout the abdomen. The
free fluid is seen surrounding multiple bowel loops in the
peritoneal cavity (image 104).
IMPRESSION: 1. Nodular, Cirrhotic Liver with moderate volume Ascites. No
discrete liver lesion.
2. Surgically absent gallbladder with chronic pneumobilia. No
biliary ductal enlargement.
3. Chronic medical renal disease.
4. Chronic calcified aortic atherosclerosis.

## 2018-02-07 IMAGING — CR DG CHEST 2V
2 series · 2 of 2 positions shown · non-contrast
Comparison: 07/30/2015 and 05/15/2013 radiographs.

CLINICAL DATA: Edema of the testicles and penis for 3 days.

EXAM:
CHEST  2 VIEW

[w chest lat]
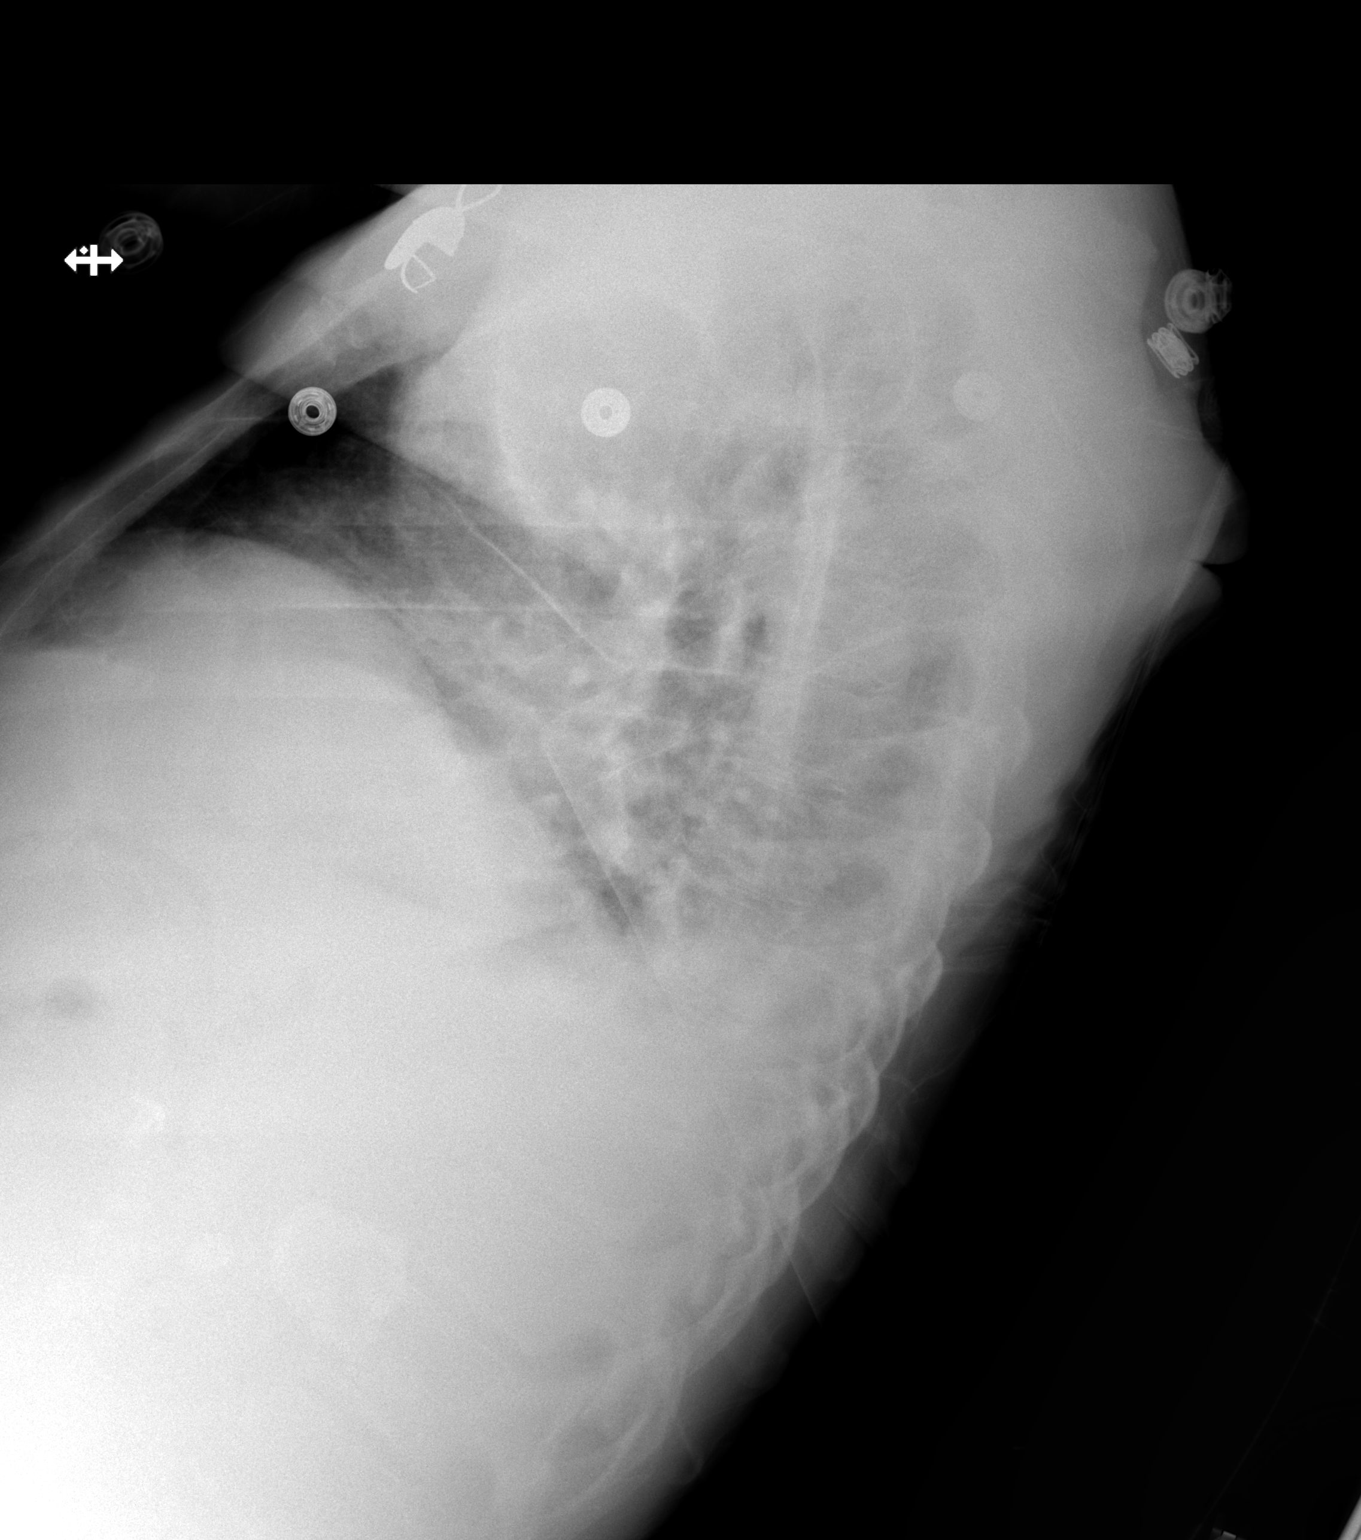

[x chest ap]
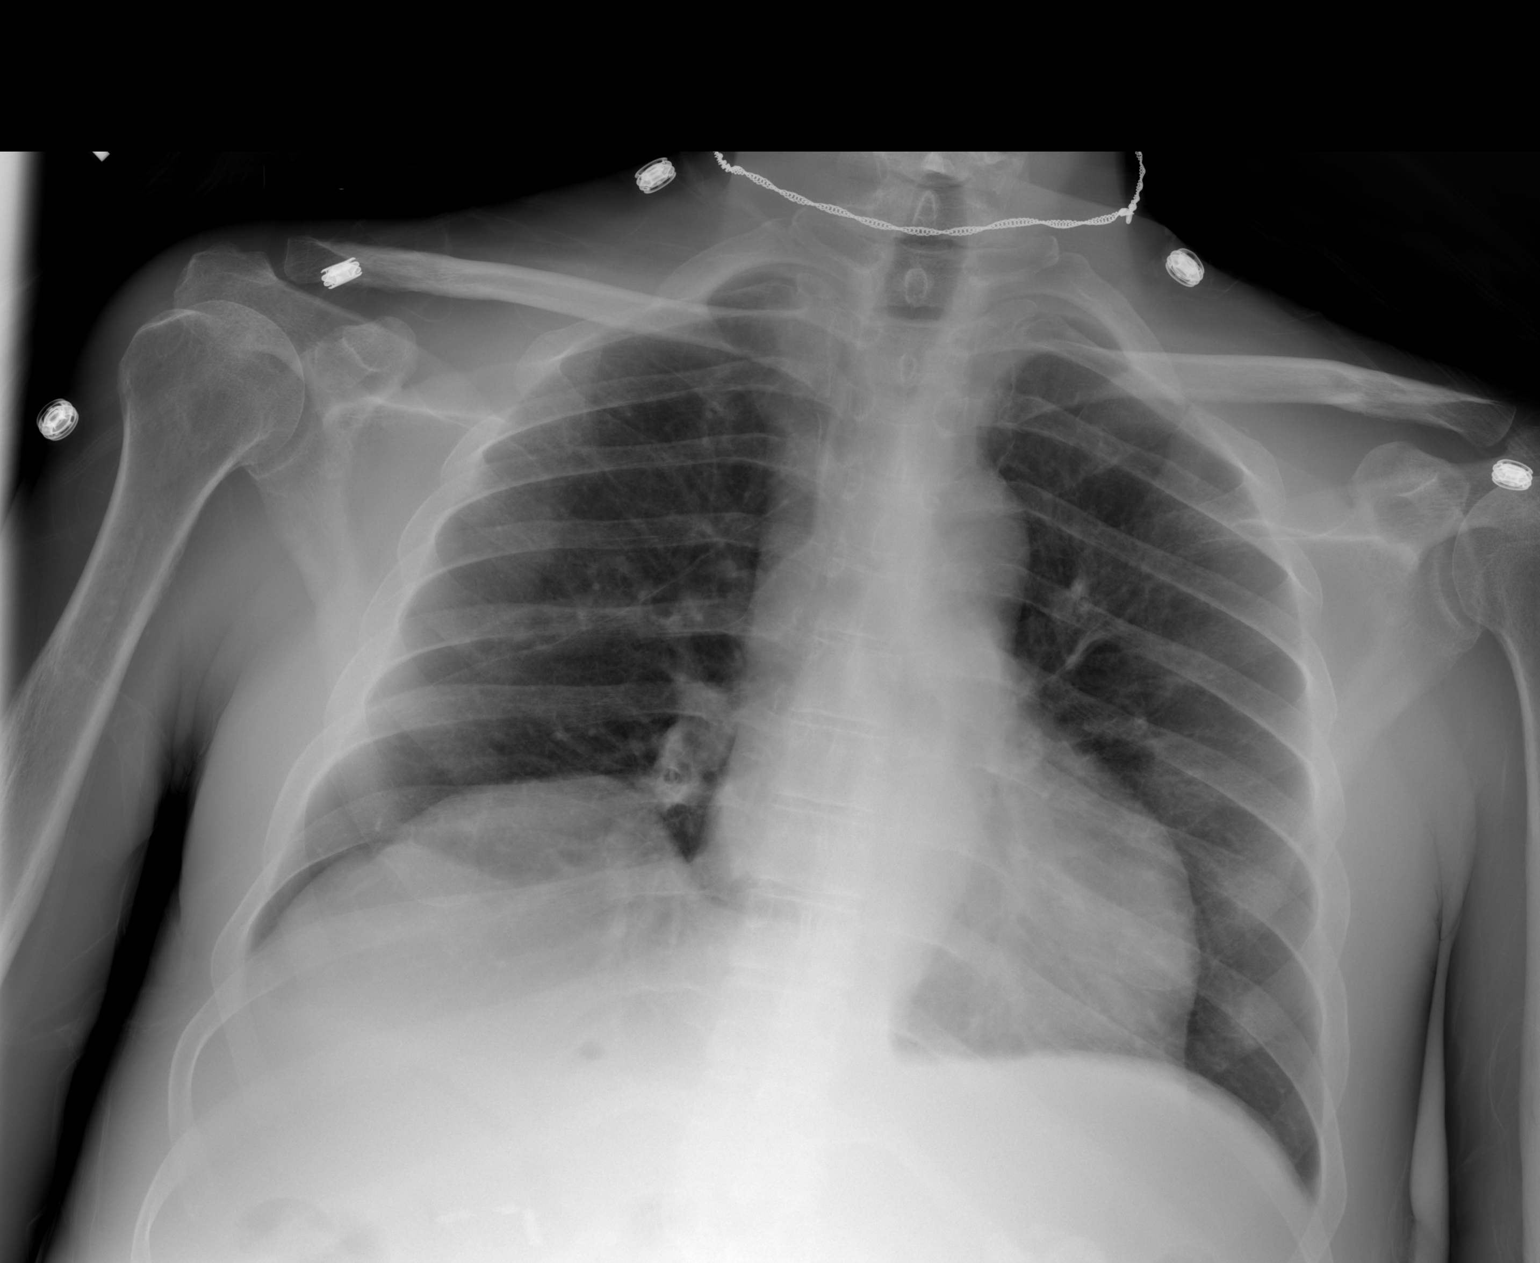

[2 of 2 positions shown; findings below may reference images not displayed]

FINDINGS: Suboptimal inspiration, especially on the lateral view. There is
mild eventration of the right hemidiaphragm. The heart size and
mediastinal contours are stable. The lungs appear clear on the
frontal examination, but under aerated on the lateral view. No
pneumothorax or significant pleural effusion identified. Left upper
quadrant abdominal calcification appears unchanged.
IMPRESSION: Suboptimal inspiration on the lateral view, resulting in bibasilar
atelectasis. No definite acute findings.
